# Patient Record
Sex: Male | Born: 1975 | Race: White | Hispanic: No | Marital: Single | Smoking: Current every day smoker
Health system: Southern US, Community
[De-identification: ages and names within clinical notes are randomized; demographics above are authoritative.]

## PROBLEM LIST (undated history)

## (undated) DIAGNOSIS — I509 Heart failure, unspecified: Secondary | ICD-10-CM

## (undated) DIAGNOSIS — F1910 Other psychoactive substance abuse, uncomplicated: Secondary | ICD-10-CM

## (undated) HISTORY — PX: HERNIA REPAIR: SHX51

---

## 2016-01-08 ENCOUNTER — Ambulatory Visit (INDEPENDENT_AMBULATORY_CARE_PROVIDER_SITE_OTHER): Payer: BLUE CROSS/BLUE SHIELD | Admitting: Family Medicine

## 2016-01-08 VITALS — BP 132/96 | HR 76 | Temp 98.2°F | Resp 17 | Ht 69.0 in | Wt 207.0 lb

## 2016-01-08 DIAGNOSIS — K029 Dental caries, unspecified: Secondary | ICD-10-CM

## 2016-01-08 DIAGNOSIS — K05219 Aggressive periodontitis, localized, unspecified severity: Secondary | ICD-10-CM

## 2016-01-08 MED ORDER — AMOXICILLIN 500 MG PO CAPS: 500.0000 mg | ORAL_CAPSULE | Freq: Three times a day (TID) | ORAL | Status: DC

## 2016-01-08 MED ORDER — HYDROCODONE-ACETAMINOPHEN 5-325 MG PO TABS: 1.0000 | ORAL_TABLET | Freq: Four times a day (QID) | ORAL | Status: DC | PRN

## 2016-01-08 NOTE — Progress Notes (Signed)
By signing my name below I, Allen Bass, attest that this documentation has been prepared under the direction and in the presence of Allen FloodJeffrey R Jmarion Christiano, MD. Electonically Signed. Allen Bass, Scribe 01/08/2016 at 11:49 AM  Subjective:    Patient ID: Allen Bass, male    DOB: 1975-08-06, 40 y.o.   MRN: [id number]  Chief Complaint  Patient presents with  . Dental Pain    facial swelling     HPI Allen Bass is a 40 y.o. male who presents to the [facility] complaining of facial swelling and dental pain. Pt states that he has an abscess tooth for the past year and there was a severe flare up of pain and swelling that started 2 days ago. Pt states he has been taking approximately 20-30 ibuprofen for pain within the last 24 hrs with minimal relief. Pt denies fever or chills.   Pt reports having similar symptoms treated with penicillin 3 months ago which resolved his facial swelling at that time.   Pt is scheduled to have his tooth removed in 2 weeks with dentist Allen Bass. Pt called Allen Bass yesterday and was recommended to be seen at an urgent care to get an antibiotic.   Pt denies history of kidney disease or gastric ulcers.   There are no active problems to display for this patient.  No past medical history on file. No past surgical history on file. No Known Allergies Prior to Admission medications   Not on File   Social History   Social History  . Marital Status: Unknown    Spouse Name: N/A  . Number of Children: N/A  . Years of Education: N/A   Occupational History  . Not on file.   Social History Main Topics  . Smoking status: Current Every Day Smoker -- 1.00 packs/day for 16 years    Types: Cigarettes  . Smokeless tobacco: Not on file  . Alcohol Use: Not on file  . Drug Use: Not on file  . Sexual Activity: Not on file   Other Topics Concern  . Not on file   Social History Narrative  . No narrative on file      Review of Systems    Constitutional: Negative for fever and chills.  HENT: Positive for dental problem and facial swelling. Negative for trouble swallowing.        Objective:   Physical Exam  Constitutional: He is oriented to person, place, and time. He appears well-developed and well-nourished. No distress.  HENT:  Head: Normocephalic and atraumatic.  Mouth/Throat: Oropharynx is clear and moist and mucous membranes are normal. Dental abscesses present.  Pt has a large area of decay on 1st molar with an abscess on the buccal side of the gingiva, no active discharge.  Pt has slight rt sided facial soft tissue swelling.  Eyes: Conjunctivae are normal. Pupils are equal, round, and reactive to light.  Neck: Neck supple.  Cardiovascular: Normal rate, regular rhythm and normal heart sounds.  Exam reveals no gallop and no friction rub.   No murmur heard. Pulmonary/Chest: Effort normal and breath sounds normal. No accessory muscle usage. He has no decreased breath sounds. He has no wheezes. He has no rhonchi. He has no rales.  Musculoskeletal: Normal range of motion.  Lymphadenopathy:    He has no cervical adenopathy.  Neurological: He is alert and oriented to person, place, and time. Gait normal.  Skin: Skin is warm and dry.  Psychiatric: He has a  normal mood and affect. His behavior is normal.  Nursing note and vitals reviewed.    Filed Vitals:   01/08/16 1059  BP: 132/96  Pulse: 76  Temp: 98.2 F (36.8 C)  TempSrc: Oral  Resp: 17  Height:  (1.753 m)  Weight: 207 lb (93.895 kg)  SpO2: 98%        Assessment & Plan:   Allen Bass is a 40 y.o. male Pain due to dental caries - Plan: amoxicillin (AMOXIL) 500 MG capsule, HYDROcodone-acetaminophen (NORCO/VICODIN) 5-325 MG tablet  Lateral periodontal abscess - Plan: amoxicillin (AMOXIL) 500 MG capsule, HYDROcodone-acetaminophen (NORCO/VICODIN) 5-325 MG tablet  Periapical abscess on the right side, beneath the mucosa from tooth number 30.   Discussed aspiration of abscess, but this was declined at current visit. No jaw tenderness, afebrile. Chose to try antibiotics initially, with follow-up with dental provider in the next 72 hours if not improving.  -Start amoxicillin 500 mg 3 times a day  -Ibuprofen up to 800 mg every 8 hours with food saltwater swish and spit. 9   -Lortab every 6 hours when necessary. Side effects discussed.  -RTC precautions.   Meds ordered this encounter  Medications  . amoxicillin (AMOXIL) 500 MG capsule    Sig: Take 1 capsule (500 mg total) by mouth 3 (three) times daily.    Dispense:  30 capsule    Refill:  0  . HYDROcodone-acetaminophen (NORCO/VICODIN) 5-325 MG tablet    Sig: Take 1 tablet by mouth every 6 (six) hours as needed for moderate pain.    Dispense:  15 tablet    Refill:  0   Patient Instructions       IF you received an x-ray today, you will receive an invoice from [facility]. Please contact [facility] at [phone] with questions or concerns regarding your invoice.   IF you received labwork today, you will receive an invoice from [employer]. Please contact [facility] at [phone] with questions or concerns regarding your invoice.   Our billing staff will not be able to assist you with questions regarding bills from these companies.  You will be contacted with the lab results as soon as they are available. The fastest way to get your results is to activate your My Chart account. Instructions are located on the last page of this paperwork. If you have not heard from [place] regarding the results in 2 weeks, please contact this office.     You do have an abscess on the outside of the affected tooth. As we discussed, this often needs to be opened. For now you can try the antibiotic, salt water swish and spit frequently, ibuprofen up to 800 mg every 8 hours with food, and other pain medication as needed. However if not improving, would  recommend you be seen by your dental provider first thing Monday. Return to the clinic or go to the nearest emergency room if any of your symptoms worsen or new symptoms occur.  Dental Abscess A dental abscess is a collection of pus in or around a tooth. CAUSES This condition is caused by a bacterial infection around the root of the tooth that involves the inner part of the tooth (pulp). It may result from:  Severe tooth decay.  Trauma to the tooth that allows bacteria to enter into the pulp, such as a broken or chipped tooth.  Severe gum disease around a tooth. SYMPTOMS Symptoms of this condition include:  Severe pain in and around the infected  tooth.  Swelling and redness around the infected tooth, in the mouth, or in the face.  Tenderness.  Pus drainage.  Bad breath.  Bitter taste in the mouth.  Difficulty swallowing.  Difficulty opening the mouth.  Nausea.  Vomiting.  Chills.  Swollen neck glands.  Fever. DIAGNOSIS This condition is diagnosed with examination of the infected tooth. During the exam, your dentist may tap on the infected tooth. Your dentist will also ask about your medical and dental history and may order X-rays. TREATMENT This condition is treated by eliminating the infection. This may be done with:  Antibiotic medicine.  A root canal. This may be performed to save the tooth.  Pulling (extracting) the tooth. This may also involve draining the abscess. This is done if the tooth cannot be saved. HOME CARE INSTRUCTIONS  Take medicines only as directed by your dentist.  If you were prescribed antibiotic medicine, finish all of it even if you start to feel better.  Rinse your mouth (gargle) often with salt water to relieve pain or swelling.  Do not drive or operate heavy machinery while taking pain medicine.  Do not apply heat to the outside of your mouth.  Keep all follow-up visits as directed by your dentist. This is important. SEEK  MEDICAL CARE IF:  Your pain is worse and is not helped by medicine. SEEK IMMEDIATE MEDICAL CARE IF:  You have a fever or chills.  Your symptoms suddenly get worse.  You have a very bad headache.  You have problems breathing or swallowing.  You have trouble opening your mouth.  You have swelling in your neck or around your eye.   This information is not intended to replace advice given to you by your health care provider. Make sure you discuss any questions you have with your health care provider.   Document Released: 07/11/2005 Document Revised: 11/25/2014 Document Reviewed: 07/08/2014 Elsevier Interactive Patient Education [employer]      I personally performed the services described in this documentation, which was scribed in my presence. The recorded information has been reviewed and considered, and addended by me as needed.   Signed,   Meredith Staggers, MD [facility].  01/08/2016 11:57 AM

## 2016-01-08 NOTE — Patient Instructions (Addendum)
IF you received an x-ray today, you will receive an invoice from [facility]. Please contact [facility] at [phone] with questions or concerns regarding your invoice.   IF you received labwork today, you will receive an invoice from [employer]. Please contact [facility] at [phone] with questions or concerns regarding your invoice.   Our billing staff will not be able to assist you with questions regarding bills from these companies.  You will be contacted with the lab results as soon as they are available. The fastest way to get your results is to activate your My Chart account. Instructions are located on the last page of this paperwork. If you have not heard from [place] regarding the results in 2 weeks, please contact this office.     You do have an abscess on the outside of the affected tooth. As we discussed, this often needs to be opened. For now you can try the antibiotic, salt water swish and spit frequently, ibuprofen up to 800 mg every 8 hours with food, and other pain medication as needed. However if not improving, would recommend you be seen by your dental provider first thing Monday. Return to the clinic or go to the nearest emergency room if any of your symptoms worsen or new symptoms occur.  Dental Abscess A dental abscess is a collection of pus in or around a tooth. CAUSES This condition is caused by a bacterial infection around the root of the tooth that involves the inner part of the tooth (pulp). It may result from:  Severe tooth decay.  Trauma to the tooth that allows bacteria to enter into the pulp, such as a broken or chipped tooth.  Severe gum disease around a tooth. SYMPTOMS Symptoms of this condition include:  Severe pain in and around the infected tooth.  Swelling and redness around the infected tooth, in the mouth, or in the face.  Tenderness.  Pus drainage.  Bad breath.  Bitter taste in the  mouth.  Difficulty swallowing.  Difficulty opening the mouth.  Nausea.  Vomiting.  Chills.  Swollen neck glands.  Fever. DIAGNOSIS This condition is diagnosed with examination of the infected tooth. During the exam, your dentist may tap on the infected tooth. Your dentist will also ask about your medical and dental history and may order X-rays. TREATMENT This condition is treated by eliminating the infection. This may be done with:  Antibiotic medicine.  A root canal. This may be performed to save the tooth.  Pulling (extracting) the tooth. This may also involve draining the abscess. This is done if the tooth cannot be saved. HOME CARE INSTRUCTIONS  Take medicines only as directed by your dentist.  If you were prescribed antibiotic medicine, finish all of it even if you start to feel better.  Rinse your mouth (gargle) often with salt water to relieve pain or swelling.  Do not drive or operate heavy machinery while taking pain medicine.  Do not apply heat to the outside of your mouth.  Keep all follow-up visits as directed by your dentist. This is important. SEEK MEDICAL CARE IF:  Your pain is worse and is not helped by medicine. SEEK IMMEDIATE MEDICAL CARE IF:  You have a fever or chills.  Your symptoms suddenly get worse.  You have a very bad headache.  You have problems breathing or swallowing.  You have trouble opening your mouth.  You have swelling in your neck or around your eye.   This  information is not intended to replace advice given to you by your health care provider. Make sure you discuss any questions you have with your health care provider.   Document Released: 07/11/2005 Document Revised: 11/25/2014 Document Reviewed: 07/08/2014 Elsevier Interactive Patient Education [employer] Inc[employer]

## 2017-07-21 ENCOUNTER — Inpatient Hospital Stay
Admission: EM | Admit: 2017-07-21 | Discharge: 2017-07-25 | DRG: 871 | Disposition: A | Discharge status: Home / Self Care | Payer: BLUE CROSS/BLUE SHIELD | Attending: Internal Medicine | Admitting: Internal Medicine

## 2017-07-21 ENCOUNTER — Emergency Department: Payer: BLUE CROSS/BLUE SHIELD

## 2017-07-21 ENCOUNTER — Encounter: Payer: Self-pay | Admitting: Internal Medicine

## 2017-07-21 ENCOUNTER — Inpatient Hospital Stay: Payer: BLUE CROSS/BLUE SHIELD

## 2017-07-21 DIAGNOSIS — J9601 Acute respiratory failure with hypoxia: Secondary | ICD-10-CM | POA: Diagnosis not present

## 2017-07-21 DIAGNOSIS — R739 Hyperglycemia, unspecified: Secondary | ICD-10-CM | POA: Diagnosis present

## 2017-07-21 DIAGNOSIS — Y92009 Unspecified place in unspecified non-institutional (private) residence as the place of occurrence of the external cause: Secondary | ICD-10-CM | POA: Diagnosis not present

## 2017-07-21 DIAGNOSIS — R000 Tachycardia, unspecified: Secondary | ICD-10-CM | POA: Diagnosis present

## 2017-07-21 DIAGNOSIS — A419 Sepsis, unspecified organism: Secondary | ICD-10-CM | POA: Diagnosis not present

## 2017-07-21 DIAGNOSIS — J690 Pneumonitis due to inhalation of food and vomit: Secondary | ICD-10-CM | POA: Diagnosis present

## 2017-07-21 DIAGNOSIS — N179 Acute kidney failure, unspecified: Secondary | ICD-10-CM | POA: Diagnosis present

## 2017-07-21 DIAGNOSIS — I509 Heart failure, unspecified: Secondary | ICD-10-CM

## 2017-07-21 DIAGNOSIS — J9602 Acute respiratory failure with hypercapnia: Secondary | ICD-10-CM | POA: Diagnosis not present

## 2017-07-21 DIAGNOSIS — I429 Cardiomyopathy, unspecified: Secondary | ICD-10-CM | POA: Diagnosis present

## 2017-07-21 DIAGNOSIS — F17210 Nicotine dependence, cigarettes, uncomplicated: Secondary | ICD-10-CM | POA: Diagnosis present

## 2017-07-21 DIAGNOSIS — R791 Abnormal coagulation profile: Secondary | ICD-10-CM | POA: Diagnosis present

## 2017-07-21 DIAGNOSIS — Z781 Physical restraint status: Secondary | ICD-10-CM

## 2017-07-21 DIAGNOSIS — E869 Volume depletion, unspecified: Secondary | ICD-10-CM | POA: Diagnosis present

## 2017-07-21 DIAGNOSIS — F1410 Cocaine abuse, uncomplicated: Secondary | ICD-10-CM | POA: Diagnosis not present

## 2017-07-21 DIAGNOSIS — R6521 Severe sepsis with septic shock: Secondary | ICD-10-CM | POA: Diagnosis present

## 2017-07-21 DIAGNOSIS — G92 Toxic encephalopathy: Secondary | ICD-10-CM | POA: Diagnosis present

## 2017-07-21 DIAGNOSIS — T50901A Poisoning by unspecified drugs, medicaments and biological substances, accidental (unintentional), initial encounter: Secondary | ICD-10-CM | POA: Diagnosis not present

## 2017-07-21 DIAGNOSIS — N170 Acute kidney failure with tubular necrosis: Secondary | ICD-10-CM | POA: Diagnosis present

## 2017-07-21 DIAGNOSIS — F1010 Alcohol abuse, uncomplicated: Secondary | ICD-10-CM | POA: Diagnosis present

## 2017-07-21 DIAGNOSIS — E874 Mixed disorder of acid-base balance: Secondary | ICD-10-CM | POA: Diagnosis present

## 2017-07-21 DIAGNOSIS — I459 Conduction disorder, unspecified: Secondary | ICD-10-CM | POA: Diagnosis present

## 2017-07-21 DIAGNOSIS — I5021 Acute systolic (congestive) heart failure: Secondary | ICD-10-CM | POA: Diagnosis not present

## 2017-07-21 DIAGNOSIS — I248 Other forms of acute ischemic heart disease: Secondary | ICD-10-CM | POA: Diagnosis present

## 2017-07-21 DIAGNOSIS — I420 Dilated cardiomyopathy: Secondary | ICD-10-CM | POA: Diagnosis not present

## 2017-07-21 DIAGNOSIS — Z8249 Family history of ischemic heart disease and other diseases of the circulatory system: Secondary | ICD-10-CM | POA: Diagnosis not present

## 2017-07-21 DIAGNOSIS — J9600 Acute respiratory failure, unspecified whether with hypoxia or hypercapnia: Secondary | ICD-10-CM

## 2017-07-21 HISTORY — DX: Other psychoactive substance abuse, uncomplicated: F19.10

## 2017-07-21 LAB — ACETAMINOPHEN LEVEL: Acetaminophen (Tylenol), Serum: <10 ug/mL — ABNORMAL LOW (ref 10–30)

## 2017-07-21 LAB — BASIC METABOLIC PANEL
Anion gap: 14 (ref 5–15)
BUN: 16 mg/dL (ref 6–20)
CALCIUM: 8.9 mg/dL (ref 8.9–10.3)
CO2: 22 mmol/L (ref 22–32)
CREATININE: 1.81 mg/dL — AB (ref 0.61–1.24)
Chloride: 98 mmol/L — ABNORMAL LOW (ref 101–111)
GFR calc Af Amer: 25 mL/min — ABNORMAL LOW (ref >60)
GFR calc non Af Amer: 21 mL/min — ABNORMAL LOW (ref >60)
GLUCOSE: 260 mg/dL — AB (ref 65–99)
Potassium: 4.7 mmol/L (ref 3.5–5.1)
Sodium: 134 mmol/L — ABNORMAL LOW (ref 135–145)

## 2017-07-21 LAB — URINALYSIS, COMPLETE (UACMP) WITH MICROSCOPIC
BACTERIA UA: NONE SEEN
BILIRUBIN URINE: NEGATIVE
Glucose, UA: >=500 mg/dL — AB
KETONES UR: NEGATIVE mg/dL
Leukocytes, UA: NEGATIVE
Nitrite: NEGATIVE
PH: 6 (ref 5.0–8.0)
PROTEIN: 100 mg/dL — AB
Specific Gravity, Urine: 1.007 (ref 1.005–1.030)

## 2017-07-21 LAB — URINE DRUG SCREEN, QUALITATIVE (ARMC ONLY)
AMPHETAMINES, UR SCREEN: NOT DETECTED
BARBITURATES, UR SCREEN: NOT DETECTED
Benzodiazepine, Ur Scrn: NOT DETECTED
Cannabinoid 50 Ng, Ur ~~LOC~~: NOT DETECTED
Cocaine Metabolite,Ur ~~LOC~~: POSITIVE — AB
MDMA (Ecstasy)Ur Screen: NOT DETECTED
METHADONE SCREEN, URINE: NOT DETECTED
OPIATE, UR SCREEN: NOT DETECTED
Phencyclidine (PCP) Ur S: NOT DETECTED
TRICYCLIC, UR SCREEN: NOT DETECTED

## 2017-07-21 LAB — CBC WITH DIFFERENTIAL/PLATELET
BASOS PCT: 1 %
Basophils Absolute: 0.1 10*3/uL (ref 0–0.1)
Eosinophils Absolute: 0.1 10*3/uL (ref 0–0.7)
Eosinophils Relative: 1 %
HEMATOCRIT: 50.5 % (ref 40.0–52.0)
HEMOGLOBIN: 16.6 g/dL (ref 13.0–18.0)
LYMPHS ABS: 2.8 10*3/uL (ref 1.0–3.6)
Lymphocytes Relative: 16 %
MCH: 29.2 pg (ref 26.0–34.0)
MCHC: 32.9 g/dL (ref 32.0–36.0)
MCV: 88.8 fL (ref 80.0–100.0)
MONOS PCT: 1 %
Monocytes Absolute: 0.2 10*3/uL (ref 0.2–1.0)
NEUTROS ABS: 14.7 10*3/uL — AB (ref 1.4–6.5)
NEUTROS PCT: 81 %
Platelets: 279 10*3/uL (ref 150–440)
RBC: 5.69 MIL/uL (ref 4.40–5.90)
RDW: 14.6 % — ABNORMAL HIGH (ref 11.5–14.5)
WBC: 17.9 10*3/uL — ABNORMAL HIGH (ref 3.8–10.6)

## 2017-07-21 LAB — BLOOD GAS, VENOUS
ACID-BASE DEFICIT: 8.9 mmol/L — AB (ref 0.0–2.0)
BICARBONATE: 23.4 mmol/L (ref 20.0–28.0)
O2 SAT: 35 %
PCO2 VEN: 79 mmHg — AB (ref 44.0–60.0)
Patient temperature: 37
pH, Ven: 7.08 — CL (ref 7.250–7.430)
pO2, Ven: 31 mmHg — CL (ref 32.0–45.0)

## 2017-07-21 LAB — ETHANOL: Alcohol, Ethyl (B): <10 mg/dL (ref <10)

## 2017-07-21 LAB — TROPONIN I: Troponin I: 0.05 ng/mL (ref <0.03)

## 2017-07-21 LAB — GLUCOSE, CAPILLARY: Glucose-Capillary: 78 mg/dL (ref 65–99)

## 2017-07-21 LAB — SALICYLATE LEVEL: Salicylate Lvl: <7 mg/dL (ref 2.8–30.0)

## 2017-07-21 LAB — LACTIC ACID, PLASMA: Lactic Acid, Venous: 7.2 mmol/L (ref 0.5–1.9)

## 2017-07-21 LAB — PROTIME-INR
INR: 1.01
PROTHROMBIN TIME: 13.2 s (ref 11.4–15.2)

## 2017-07-21 MED ORDER — NOREPINEPHRINE BITARTRATE 1 MG/ML IV SOLN
0.0000 ug/min | INTRAVENOUS | Status: DC
  Filled 2017-07-21: qty 4

## 2017-07-21 MED ORDER — MIDAZOLAM HCL 2 MG/2ML IJ SOLN
2.0000 mg | INTRAMUSCULAR | Status: DC | PRN
  Administered 2017-07-22 – 2017-07-23 (×5): 4 mg via INTRAVENOUS
  Filled 2017-07-21 (×7): qty 4

## 2017-07-21 MED ORDER — IPRATROPIUM-ALBUTEROL 0.5-2.5 (3) MG/3ML IN SOLN
3.0000 mL | Freq: Four times a day (QID) | RESPIRATORY_TRACT | Status: DC
  Administered 2017-07-22 – 2017-07-23 (×8): 3 mL via RESPIRATORY_TRACT
  Filled 2017-07-21 (×8): qty 3

## 2017-07-21 MED ORDER — INSULIN ASPART 100 UNIT/ML ~~LOC~~ SOLN
0.0000 [IU] | SUBCUTANEOUS | Status: DC
  Administered 2017-07-24 (×2): 1 [IU] via SUBCUTANEOUS
  Filled 2017-07-21 (×2): qty 1

## 2017-07-21 MED ORDER — DEXTROSE 5 % IV SOLN
500.0000 mg | Freq: Once | INTRAVENOUS | Status: AC
  Administered 2017-07-21: 500 mg via INTRAVENOUS
  Filled 2017-07-21: qty 500

## 2017-07-21 MED ORDER — MIDAZOLAM HCL 2 MG/2ML IJ SOLN
2.0000 mg | INTRAMUSCULAR | Status: AC | PRN
  Administered 2017-07-21 – 2017-07-22 (×3): 4 mg via INTRAVENOUS
  Filled 2017-07-21 (×3): qty 4

## 2017-07-21 MED ORDER — FENTANYL BOLUS VIA INFUSION
50.0000 ug | INTRAVENOUS | Status: DC | PRN
Start: 2017-07-21 — End: 2017-07-23
  Filled 2017-07-21: qty 100

## 2017-07-21 MED ORDER — ASPIRIN 300 MG RE SUPP: 300.0000 mg | RECTAL | Status: AC

## 2017-07-21 MED ORDER — CHLORHEXIDINE GLUCONATE 0.12% ORAL RINSE (MEDLINE KIT)
15.0000 mL | Freq: Two times a day (BID) | OROMUCOSAL | Status: DC
  Administered 2017-07-22 (×3): 15 mL via OROMUCOSAL

## 2017-07-21 MED ORDER — FENTANYL 2500MCG IN NS 250ML (10MCG/ML) PREMIX INFUSION
0.0000 ug/h | INTRAVENOUS | Status: DC
Start: 2017-07-21 — End: 2017-07-21
  Filled 2017-07-21: qty 250

## 2017-07-21 MED ORDER — SENNOSIDES 8.8 MG/5ML PO SYRP
5.0000 mL | ORAL_SOLUTION | Freq: Two times a day (BID) | ORAL | Status: DC | PRN
  Filled 2017-07-21: qty 5

## 2017-07-21 MED ORDER — ORAL CARE MOUTH RINSE
15.0000 mL | Freq: Four times a day (QID) | OROMUCOSAL | Status: DC
  Administered 2017-07-22 (×3): 15 mL via OROMUCOSAL

## 2017-07-21 MED ORDER — SUCCINYLCHOLINE CHLORIDE 20 MG/ML IJ SOLN
150.0000 mg | Freq: Once | INTRAMUSCULAR | Status: AC
  Administered 2017-07-21: 150 mg via INTRAVENOUS

## 2017-07-21 MED ORDER — SODIUM CHLORIDE 0.9 % IV SOLN: 250.0000 mL | INTRAVENOUS | Status: DC | PRN

## 2017-07-21 MED ORDER — HEPARIN SODIUM (PORCINE) 5000 UNIT/ML IJ SOLN
5000.0000 [IU] | Freq: Three times a day (TID) | INTRAMUSCULAR | Status: DC
  Administered 2017-07-22 – 2017-07-25 (×10): 5000 [IU] via SUBCUTANEOUS
  Filled 2017-07-21 (×10): qty 1

## 2017-07-21 MED ORDER — MIDAZOLAM HCL 5 MG/5ML IJ SOLN
2.0000 mg | Freq: Once | INTRAMUSCULAR | Status: AC
  Administered 2017-07-21: 2 mg via INTRAVENOUS

## 2017-07-21 MED ORDER — PANTOPRAZOLE SODIUM 40 MG IV SOLR
40.0000 mg | Freq: Every day | INTRAVENOUS | Status: DC
  Administered 2017-07-22 – 2017-07-25 (×4): 40 mg via INTRAVENOUS
  Filled 2017-07-21 (×4): qty 40

## 2017-07-21 MED ORDER — AMPICILLIN-SULBACTAM SODIUM 3 (2-1) G IJ SOLR
3.0000 g | Freq: Once | INTRAMUSCULAR | Status: AC
  Administered 2017-07-22: 3 g via INTRAVENOUS
  Filled 2017-07-21: qty 3

## 2017-07-21 MED ORDER — SODIUM CHLORIDE 0.9 % IV BOLUS (SEPSIS)
1000.0000 mL | INTRAVENOUS | Status: AC
  Administered 2017-07-21 – 2017-07-22 (×2): 1000 mL via INTRAVENOUS

## 2017-07-21 MED ORDER — BISACODYL 10 MG RE SUPP: 10.0000 mg | Freq: Every day | RECTAL | Status: DC | PRN

## 2017-07-21 MED ORDER — ASPIRIN 81 MG PO CHEW: 324.0000 mg | CHEWABLE_TABLET | ORAL | Status: AC

## 2017-07-21 MED ORDER — SODIUM CHLORIDE 0.9 % IV BOLUS (SEPSIS)
1000.0000 mL | Freq: Once | INTRAVENOUS | Status: AC
  Administered 2017-07-22: 1000 mL via INTRAVENOUS

## 2017-07-21 MED ORDER — FENTANYL CITRATE (PF) 100 MCG/2ML IJ SOLN
INTRAMUSCULAR | Status: AC
  Filled 2017-07-21: qty 10

## 2017-07-21 MED ORDER — LEVALBUTEROL HCL 0.63 MG/3ML IN NEBU: 0.6300 mg | INHALATION_SOLUTION | Freq: Four times a day (QID) | RESPIRATORY_TRACT | Status: DC | PRN

## 2017-07-21 MED ORDER — SODIUM CHLORIDE 0.9 % IV BOLUS (SEPSIS)
1000.0000 mL | Freq: Once | INTRAVENOUS | Status: AC
  Administered 2017-07-21: 1000 mL via INTRAVENOUS

## 2017-07-21 MED ORDER — FENTANYL CITRATE (PF) 100 MCG/2ML IJ SOLN
100.0000 ug | Freq: Once | INTRAMUSCULAR | Status: AC
  Administered 2017-07-21: 100 ug via INTRAVENOUS

## 2017-07-21 MED ORDER — PROPOFOL 1000 MG/100ML IV EMUL
INTRAVENOUS | Status: AC
  Filled 2017-07-21: qty 100

## 2017-07-21 MED ORDER — CHLORHEXIDINE GLUCONATE 0.12% ORAL RINSE (MEDLINE KIT): 15.0000 mL | Freq: Two times a day (BID) | OROMUCOSAL | Status: DC

## 2017-07-21 MED ORDER — SODIUM CHLORIDE 0.9 % IV SOLN
0.5000 mg/h | INTRAVENOUS | Status: DC
  Administered 2017-07-21: 4 mg/h via INTRAVENOUS
  Filled 2017-07-21: qty 10

## 2017-07-21 MED ORDER — PROPOFOL 1000 MG/100ML IV EMUL
5.0000 ug/kg/min | Freq: Once | INTRAVENOUS | Status: AC
  Administered 2017-07-21: 5 ug/kg/min via INTRAVENOUS

## 2017-07-21 MED ORDER — AZITHROMYCIN 500 MG IV SOLR
500.0000 mg | INTRAVENOUS | Status: DC
  Administered 2017-07-23 – 2017-07-24 (×2): 500 mg via INTRAVENOUS
  Filled 2017-07-21 (×3): qty 500

## 2017-07-21 MED ORDER — SODIUM CHLORIDE 0.9 % IV BOLUS (SEPSIS): 1000.0000 mL | Freq: Once | INTRAVENOUS | Status: DC

## 2017-07-21 MED ORDER — FAMOTIDINE IN NACL 20-0.9 MG/50ML-% IV SOLN
20.0000 mg | Freq: Two times a day (BID) | INTRAVENOUS | Status: DC
  Administered 2017-07-22 – 2017-07-24 (×6): 20 mg via INTRAVENOUS
  Filled 2017-07-21 (×7): qty 50

## 2017-07-21 MED ORDER — ACETAMINOPHEN 325 MG PO TABS: 650.0000 mg | ORAL_TABLET | ORAL | Status: DC | PRN

## 2017-07-21 MED ORDER — ORAL CARE MOUTH RINSE: 15.0000 mL | Freq: Four times a day (QID) | OROMUCOSAL | Status: DC

## 2017-07-21 MED ORDER — ONDANSETRON HCL 4 MG/2ML IJ SOLN
4.0000 mg | Freq: Four times a day (QID) | INTRAMUSCULAR | Status: DC | PRN
  Administered 2017-07-23: 4 mg via INTRAVENOUS
  Filled 2017-07-21: qty 2

## 2017-07-21 MED ORDER — FENTANYL CITRATE (PF) 2500 MCG/50ML IJ SOLN: 0.0000 ug/h | INTRAMUSCULAR | Status: DC

## 2017-07-21 MED ORDER — FENTANYL 2500MCG IN NS 250ML (10MCG/ML) PREMIX INFUSION
25.0000 ug/h | INTRAVENOUS | Status: DC
  Administered 2017-07-21: 200 ug/h via INTRAVENOUS
  Administered 2017-07-22 – 2017-07-23 (×5): 400 ug/h via INTRAVENOUS
  Filled 2017-07-21 (×5): qty 250

## 2017-07-21 MED ORDER — ETOMIDATE 2 MG/ML IV SOLN
50.0000 mg | Freq: Once | INTRAVENOUS | Status: AC
  Administered 2017-07-21: 50 mg via INTRAVENOUS

## 2017-07-21 NOTE — ED Triage Notes (Signed)
Pt brought in by [facility] from home.  Pt found by EMS with needles and blood on couch around him.  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  Pt responding to pain only.  Pt had nasal trumpet placed at 1905 , size #7 in R nare by Dr. Shaune PollackLord.  Pt on NRB at this time sat 100%.  Upon EMS arrival, pt O2 70s on RA.  EMS reports 2 narcan given intranasal by FD and 2 narcan given IV.  Pt shaking.  Gag reflex intact when nasal trumpet placed.

## 2017-07-21 NOTE — Consult Note (Signed)
PULMONARY / CRITICAL CARE MEDICINE   Name: Allen Bass MRN: [id number] DOB: Mar 18, 1976    ADMISSION DATE:  07/21/2017   CONSULTATION DATE:  07/21/2017  REFERRING MD: Dr. Anne Hahn  Reason for consult: Acute hypoxemic respiratory failure  HISTORY OF PRESENT ILLNESS:   This is a 41 year old Caucasian male with no known medical history on file who presented to the emergency room after being found unresponsive.  History is obtained from the ED and EMS records as patient is currently intubated and sedated.  Patient was found unresponsive by his roommate who called EMS.  Upon EMS arrival, patient was found unresponsive, surrounded by blood and IV drug use paraphernalia.  Per patient's roommate, patient abuses heroine, cocaine and narcotics.  Patient was given Narcan by EMS without any improvement in mentation.  However, his respirations did improve but not sufficient enough to maintain his oxygenation.  At the ED, he remained hypoxic and hypercarbic with severe agitation hence he was intubated for acute respiratory failure. His chest x-ray showed diffuse multifocal pneumonia likely secondary to aspiration.  Urine toxicology was positive for cocaine.  He is being admitted to the ICU for further management  PAST MEDICAL HISTORY :  He  has no past medical history on file.  PAST SURGICAL HISTORY: He  has no past surgical history on file.  No Known Allergies  No current facility-administered medications on file prior to encounter.    No current outpatient medications on file prior to encounter.    FAMILY HISTORY:  His has no family status information on file.    SOCIAL HISTORY: He  reports that he uses drugs. Drugs: Cocaine and Heroin.  REVIEW OF SYSTEMS:   Unable to obtain as patient is currently sedated and intubated  SUBJECTIVE:  VITAL SIGNS: BP (!) 131/101   Pulse (!) 133   Temp 99.6 F (37.6 C) (Core (Comment))   Resp (!) 26   Wt 113.4 kg (250 lb)   SpO2 100%    HEMODYNAMICS:    VENTILATOR SETTINGS: Vent Mode: AC FiO2 (%):  [30 %-100 %] 100 % Set Rate:  [18 bmp] 18 bmp Vt Set:  [500 mL] 500 mL PEEP:  [5 cmH20] 5 cmH20  INTAKE / OUTPUT: No intake/output data recorded.  PHYSICAL EXAMINATION: General: Well-nourished, well-developed, appropriate for age Neuro: Withdraws to pain, intermittent agitation, moves all extremities HEENT: Pupils pinpoint, sluggish, trachea midline, ET tube in place Cardiovascular: Apical pulse regular, S1-S2, no murmur regurg or gallop, +2 pulses bilaterally, no edema Lungs: Bilateral breath sounds with diffuse rhonchi in [place] in all lung fields, no wheezing Abdomen: Soft, nontender nondistended normal bowel sounds in all 4 quadrants Musculoskeletal: No joint deformities, positive range of motion Skin: Diaphoretic, no rash  LABS:  BMET Recent Labs  Lab 07/21/17 1915  NA 134*  K 4.7  CL 98*  CO2 22  BUN 16  CREATININE 1.81*  GLUCOSE 260*    Electrolytes Recent Labs  Lab 07/21/17 1915  CALCIUM 8.9    CBC Recent Labs  Lab 07/21/17 1915  WBC 17.9*  HGB 16.6  HCT 50.5  PLT 279    Coag's Recent Labs  Lab 07/21/17 1930  INR 1.01    Sepsis Markers Recent Labs  Lab 07/21/17 1930  LATICACIDVEN 7.2*    ABG No results for input(s): PHART, PCO2ART, PO2ART in the last 168 hours.  Liver Enzymes No results for input(s): AST, ALT, ALKPHOS, BILITOT, ALBUMIN in the last 168 hours.  Cardiac Enzymes Recent Labs  Lab  07/21/17 1915  TROPONINI 0.05*    Glucose No results for input(s): GLUCAP in the last 168 hours.  Imaging Dg Chest Port 1 View  Result Date: 07/21/2017 CLINICAL DATA:  Heroin overdose. EXAM: PORTABLE CHEST 1 VIEW COMPARISON:  July 21, 2017 FINDINGS: Heart size is normal. Mediastinal contour is normal. Endotracheal tube is identified with distal tip 5.8 cm from carina. Nasogastric tube is noted with distal tip not included on the film but is at least in the stomach.  Patchy consolidation of bilateral lungs are noted. There is no pleural effusion. No acute abnormalities identified within the bony structures. IMPRESSION: Patchy consolidation of bilateral which may be due to asymmetric edema, ARDS or pneumonia. This is not significantly changed compared to prior exam. Endotracheal tube in good position.  There is no pneumothorax. Electronically Signed   By: Sherian ReinWei-Chen  Lin M.D.   On: 07/21/2017 21:14   Dg Chest Port 1 View  Result Date: 07/21/2017 CLINICAL DATA:  Male with hypoxia and possible overdose. EXAM: PORTABLE CHEST 1 VIEW COMPARISON:  None. FINDINGS: Diffuse hazy airspace density primarily involving the left lung may represent asymmetric edema, ARDS, or pneumonia. Clinical correlation is recommended. No lobar consolidation, pleural effusion, or pneumothorax. The cardiac silhouette is within normal limits. No acute osseous pathology. IMPRESSION: Findings may represent asymmetric edema, ARDS or pneumonia. Clinical correlation is recommended. Electronically Signed   By: Elgie CollardArash  Radparvar M.D.   On: 07/21/2017 19:49   Dg Abd Portable 1 View  Result Date: 07/21/2017 CLINICAL DATA:  Feeding tube placement. EXAM: PORTABLE ABDOMEN - 1 VIEW COMPARISON:  None. FINDINGS: The bowel gas pattern is normal. Nasogastric tube is identified with distal tip in the mid stomach. Patchy consolidation of the bilateral lungs are noted. IMPRESSION: Nasogastric tube is identified with distal tip in the mid stomach. Electronically Signed   By: Sherian ReinWei-Chen  Lin M.D.   On: 07/21/2017 21:16   STUDIES:  None  CULTURES: Blood cultures x2 Urine culture Sputum culture  ANTIBIOTICS: Unasyn was 1 Zosyn Azithromycin  SIGNIFICANT EVENTS: 07/21/2017: Admitted  LINES/TUBES: Peripheral IVs Foley catheter ET tube  DISCUSSION: 41 year old male presenting with acute hypoxic/hypercarbic respiratory failure secondary to overdose, acute encephalopathy, AKI and severe lactic  acidosis  ASSESSMENT / PLAN:  PULMONARY A: Acute hypoxic/hypercarbic respiratory failure Multifocal pneumonia secondary to aspiration ARDS Lactic acidosis P:   Full vent support with current settings Stat ABG post intubation reviewed vent changes made Nebulized bronchodilators Chest x-ray and ABG as needed Weaning trials as tolerated  CARDIOVASCULAR A: Septic shock Elevated troponin- mild troponin leak likely due to demand ischemia and hypoxemia P:  Trend cardiac enzymes Hemodynamics per ICU protocol IV fluids and as needed pressors to maintain mean arterial blood pressure greater than 65  RENAL A:   Acute renal failure secondary to volume depletion P:   IV fluids Trend creatinine Monitor and replace electrolytes  GASTROINTESTINAL A:   No acute issues P:   PPI for GI prophylaxis  HEMATOLOGIC A:   No acute issues P:  Subcu heparin for DVT prophylaxis  INFECTIOUS A:   Multifocal pneumonia Sepsis secondary to pneumonia P:   Breath excess of both Follow-up cultures Trend pro-calcitonin and adjust antibiotics if needed  ENDOCRINE A:   Hyperglycemia without a diagnosis of diabetes P:   Blood glucose monitoring every 4 hours with sensitive sliding scale insulin coverage  NEUROLOGIC A:   Acute encephalopathy secondary to hypoxemia, hypercarbia and drug abuse Severe agitation  p:   RASS goal: -1 to -2  Fentanyl and Versed for vent sedation Treat hypercarbia and hypoxemia IV fluids   FAMILY  - Updates: No family at bedside.  Will update when available  - Inter-disciplinary family meet or Palliative Care meeting due by:  day 7  Critical care time 90 minutes  Efosa Treichler S. [facility] ANP-BC Pulmonary and Critical Care Medicine [facility] Pager [phone] or [phone] 07/21/2017, 10:06 PM

## 2017-07-21 NOTE — Progress Notes (Signed)
Pharmacy Antibiotic Note  Allen Bass is a [age] y.o. male admitted on 07/21/2017 with pneumonia.  Pharmacy has been consulted for azithromycin dosing.  Found unresponsive with hx of drug use per roommate  Plan: Patient to receive Azithromycin 500mg  IV x 1 in ER. Will continue with Azithromycin 500 mg IV q24h  Weight: 250 lb (113.4 kg)  Temp (24hrs), Avg:94.3 F (34.6 C), Min:94.3 F (34.6 C), Max:94.3 F (34.6 C)  Recent Labs  Lab 07/21/17 1915  WBC 17.9*  CREATININE 1.81*    CrCl cannot be calculated (Unknown ideal weight.).    No Known Allergies  Antimicrobials this admission: Unasyn x 1 12/28 >>   Azith 12/28 >>    Dose adjustments this admission:    Microbiology results: 12/28 BCx: pending   UCx:      Sputum:      MRSA PCR:    Thank you for allowing pharmacy to be a part of this patient's care.  Blakeley Margraf A 07/21/2017 8:12 PM

## 2017-07-21 NOTE — ED Provider Notes (Addendum)
[facility] Provider Note ____________________________________________   I have reviewed the triage vital signs and the triage nursing note.  HISTORY  Chief Complaint Drug Overdose   Historian Level 5 Caveat History Limited by critical illness, overdose, altered mental status  History from EMS at the scene  HPI Allen Bass is a 41 y.o. male, unknown name and date of birth, appears to be in his 330s-40s, found by roommate unresponsive and EMS was called and found the patient surrounded by blood and paraphernalia of IV drug abuse.  Roommate apparently stated the patient does use heroin, cocaine as well as narcotics.  Clinically presumed overdose.  Fire first responders had given to milligrams Narcan nasal, and EMTs gave 2 additional milligrams of Narcan by IV.  Patient did increase his respirations and was slightly agitated, certainly responding to painful stimuli, but not opening his eyes or following commands.   No past medical history on file.  Patient Active Problem List   Diagnosis Date Noted  . Sepsis (HCC) 07/21/2017  . Acute respiratory failure with hypoxia and hypercapnia (HCC) 07/21/2017  . Drug overdose 07/21/2017  . Aspiration pneumonia (HCC) 07/21/2017  . AKI (acute kidney injury) (HCC) 07/21/2017      Prior to Admission medications   Not on File    No Known Allergies  No family history on file.  Social History Social History   Tobacco Use  . Smoking status: Not on file  Substance Use Topics  . Alcohol use: Not on file  . Drug use: Yes    Types: Cocaine, Heroin    Review of Systems   Unknown due to critical illness/overdose/altered mental status.  ____________________________________________   PHYSICAL EXAM:  VITAL SIGNS: ED Triage Vitals [07/21/17 1913]  Enc Vitals Group     BP (!) 131/101     Pulse Rate (!) 133     Resp (!) 26     Temp (!) 94.3 F (34.6 C)     Temp Source Axillary      SpO2 100 %     Weight      Height      Head Circumference      Peak Flow      Pain Score      Pain Loc      Pain Edu?      Excl. in GC?      Constitutional: Awake, moaning, not opening his eyes, he does localize to pain. HEENT   Head: Normocephalic and atraumatic.      Eyes: Conjunctivae are normal. Pupils equal and round, small, 2 mm.      Ears:         Nose: No congestion/rhinnorhea.   Mouth/Throat: Mucous membranes are moist.  Gag reflex intact.   Neck: No stridor. Cardiovascular/Chest: Cardiac rate, regular rhythm.  No murmurs, rubs, or gallops. Respiratory: Normal respiratory effort without tachypnea nor retractions.  Rhonchi throughout. Gastrointestinal: Soft.  Obese. Genitourinary/rectal:Deferred Musculoskeletal: Moving 4 extremities.  No obvious evidence of trauma. Neurologic: No facial droop.  Patient is localizing/responding to pain, but only moaning verbally.  He does have an intact gag reflex.  He is moving 4 extremities voluntarily.   Skin:  Skin is warm, dry and intact. No rash noted.   ____________________________________________  LABS (pertinent positives/negatives) I, Governor Rooksebecca Temekia Caskey, MD the attending physician have reviewed the labs noted below.  Labs Reviewed  ACETAMINOPHEN LEVEL - Abnormal; Notable for the following components:      Result Value  Acetaminophen (Tylenol), Serum <10 (*)    All other components within normal limits  BASIC METABOLIC PANEL - Abnormal; Notable for the following components:   Sodium 134 (*)    Chloride 98 (*)    Glucose, Bld 260 (*)    Creatinine, Ser 1.81 (*)    GFR calc non Af Amer 21 (*)    GFR calc Af Amer 25 (*)    All other components within normal limits  TROPONIN I - Abnormal; Notable for the following components:   Troponin I 0.05 (*)    All other components within normal limits  LACTIC ACID, PLASMA - Abnormal; Notable for the following components:   Lactic Acid, Venous 7.2 (*)    All other components  within normal limits  CBC WITH DIFFERENTIAL/PLATELET - Abnormal; Notable for the following components:   WBC 17.9 (*)    RDW 14.6 (*)    Neutro Abs 14.7 (*)    All other components within normal limits  BLOOD GAS, VENOUS - Abnormal; Notable for the following components:   pH, Ven 7.08 (*)    pCO2, Ven 79 (*)    pO2, Ven 31.0 (*)    Acid-base deficit 8.9 (*)    All other components within normal limits  URINALYSIS, COMPLETE (UACMP) WITH MICROSCOPIC - Abnormal; Notable for the following components:   Color, Urine YELLOW (*)    APPearance HAZY (*)    Glucose, UA >=500 (*)    Hgb urine dipstick MODERATE (*)    Protein, ur 100 (*)    Squamous Epithelial / LPF 0-5 (*)    All other components within normal limits  URINE DRUG SCREEN, QUALITATIVE ([facility] ONLY) - Abnormal; Notable for the following components:   Cocaine Metabolite,Ur [place] POSITIVE (*)    All other components within normal limits  CULTURE, BLOOD (ROUTINE X 2)  CULTURE, BLOOD (ROUTINE X 2)  ETHANOL  SALICYLATE LEVEL  PROTIME-INR  LACTIC ACID, PLASMA    ____________________________________________    EKG I, Governor Rooksebecca Joshuajames Moehring, MD, the attending physician have personally viewed and interpreted all ECGs.  136 bpm.  Sinus tachycardia.  Nonspecific intraventricular conduction delay.  Normal axis.  Nonspecific ST and T wave ____________________________________________  RADIOLOGY All Xrays were viewed by me.  Imaging interpreted by Radiologist, and I, Governor Rooksebecca Amilio Zehnder, MD the attending physician have reviewed the radiologist interpretation noted below.  Chest x-ray portable:  FINDINGS: Diffuse hazy airspace density primarily involving the left lung may represent asymmetric edema, ARDS, or pneumonia. Clinical correlation is recommended. No lobar consolidation, pleural effusion, or pneumothorax. The cardiac silhouette is within normal limits. No acute osseous pathology.  IMPRESSION: Findings may represent asymmetric edema, ARDS or  pneumonia. Clinical correlation is recommended. __________________________________________  PROCEDURES  Procedure(s) performed: INTUBATION Performed by: Governor Rooksebecca Leonette Tischer  Required items: required blood products, implants, devices, and special equipment available Patient identity confirmed: provided demographic data and hospital-assigned identification number   Indications: Hypercarbic respiratory failure, hypoxic respiratory failure, agitation  Intubation method: Glidescope Laryngoscopy   Preoxygenation: BiPAP/BVM  Sedatives: Etomidate Paralytic: Succinylcholine  Tube Size: 7.5 cuffed  Post-procedure assessment: chest rise and ETCO2 monitor Breath sounds: equal and absent over the epigastrium Tube secured with: ETT holder Chest x-ray interpreted by radiologist and me.  Chest x-ray findings: endotracheal tube in appropriate position  Patient tolerated the procedure well with no immediate complications.     Critical Care performed: CRITICAL CARE Performed by: Governor Rooksebecca Shaden Higley   Total critical care time: 60 minutes  Critical care time was exclusive  of separately billable procedures and treating other patients.  Critical care was necessary to treat or prevent imminent or life-threatening deterioration.  Critical care was time spent personally by me on the following activities: development of treatment plan with patient and/or surrogate as well as nursing, discussions with consultants, evaluation of patient's response to treatment, examination of patient, obtaining history from patient or surrogate, ordering and performing treatments and interventions, ordering and review of laboratory studies, ordering and review of radiographic studies, pulse oximetry and re-evaluation of patient's condition.    ____________________________________________  ED COURSE / ASSESSMENT AND PLAN  Pertinent labs & imaging results that were available during my care of the patient were reviewed by me  and considered in my medical decision making (see chart for details).    Admit patient of the room brought in by EMS due to unresponsive due to presumed narcotic/likely heroin overdose.  Patient was found by paraphernalia of IV drug abuse.  No clinical concern based on description from EMS on on exam for trauma at this point.  If mental status does not improve, might reconsider.  He is agitated and tremulous, not opening his eyes, moaning sound but localizing to pain, initial O2 sat was in the 70s, patient was placed on nonrebreather with a nasal trumpet which confirmed patient does have a good gag reflex, and O2 sat came up into the 90s.  Patient tachycardic, started on IV fluids.  Chest x-ray shows pulmonary edema versus early hours versus pneumonia/aspiration.  I will go ahead and place patient on antibiotic coverage for pneumonia as well as aspiration pneumonia with Unasyn and azithromycin.  Blood cultures have been sent.  ABG significant for low pH, elevated PCO2.  Patient will be tried on BiPAP to see if he improves with that prior to decision about possible intubation.  Discussed with hospitalist for ICU admission.  DIFFERENTIAL DIAGNOSIS: Including but not limited to drug intoxication, drug overdose, acute renal failure, sepsis, hypothermia from exposure, seizure/postictal, electrolyte disturbance, etc.  CONSULTATIONS: Hospitalist for admission, discussed with Dr. Katheren Shams.   Patient / Family / Caregiver informed of clinical course, medical decision-making process, and agree with plan.   Addended to include after discussion with hospitalist, Dr. Anne Hahn, we discussed the best plan for care the patient overnight in the ICU.  Given no intensivist in-house in the ICU, it is safest for the patient to go ahead and intubate now in the emergency department.  He does have an improving mental status compared to when he came in tonight, however he is still agitated and unable to follow  commands.  He is likely going to require medication to manage the agitation, which I think would drop his respiratory drive and create an emergency respiratory failure situation when intervention might be less controlled than doing the intubation right now.  In addition, given the severity of his chest x-ray findings right now, I do have some concern that if this is or is it may worsen before it gets better, once again exposing the patient to risk if he were to worsen without being able to access intubation quickly if he decompensates acutely.  Patient was opening his eyes, but unable to follow commands.  Patient was preoxygenated on the BiPAP, patient was given etomidate and succinylcholine and was intubated without complication with glide scope and 7.5 tube.  Sedated with propofol.  ___________________________________________   FINAL CLINICAL IMPRESSION(S) / ED DIAGNOSES   Final diagnoses:  Acute respiratory failure with hypoxia and hypercapnia (HCC)  ___________________________________________        Note: This dictation was prepared with Dragon dictation. Any transcriptional errors that result from this process are unintentional    Governor Rooks, MD 07/21/17 2013    Governor Rooks, MD 07/21/17 2113

## 2017-07-21 NOTE — ED Notes (Signed)
Date and time results received: 07/21/17 7:57 PM  Test: Troponin Critical Value: 0.05  Name of Provider Notified: Dr. Shaune PollackLord  Orders Received? Or Actions Taken?: acknowledged

## 2017-07-21 NOTE — Progress Notes (Signed)
eLink Physician-Brief Progress Note Patient Name: Allen Bass DOB: 07/26/1975 MRN: [id number]   Date of Service  07/21/2017  HPI/Events of Note  89 M with h/o of drug abuse found unresponsive by roommate with drug paraphernalia nearby.  Positive for cocaine.  Initially patient was moderately responsive on BiPAP but there was concern for airway protection and also concern for aspiration.  On camera check the patient is intubated and sedated on fentanyl/versed.  He is HD stable with RR of 17, HR of 87, BP 103/67 (78) and sats of 100%.  Of note lactate was elevated at 7.2 and trop I was 0.05  ABG showed a mixed resp and metabolic acidosis  eICU Interventions  Vent/Sedation orders ABX per primary admitting team Trend lacate/Trop Recheck ABG PCCM to see Continue [facility] via [facility]     Intervention Category Evaluation Type: New Patient Evaluation  DETERDING,ELIZABETH 07/21/2017, 10:44 PM

## 2017-07-21 NOTE — Progress Notes (Signed)
   07/21/17 1905  Clinical Encounter Type  Visited With Patient  Referral From Nurse  Consult/Referral To Chaplain   Chaplain responded to page.  Per nurse, pt was non responsive.  No family was present.  Per EMT no family would be coming in at this time.  Chaplain remains available for spiritual/emotional support.  Rev. Kamala Kolton Zenaida NieceVan [employer] Chaplain

## 2017-07-21 NOTE — H&P (Signed)
[facility] [place] [facility]   PATIENT NAME: Allen Bass    MR#:  [id number]  DATE OF BIRTH:  06/10/1976  DATE OF ADMISSION:  07/21/2017  PRIMARY CARE PHYSICIAN: No primary care provider on file.   REQUESTING/REFERRING PHYSICIAN: Shaune PollackLord, MD  CHIEF COMPLAINT:   Chief Complaint  Patient presents with  . Drug Overdose    HISTORY OF PRESENT ILLNESS:  Allen Bass  is a 41 y.o. male who presents with acute respiratory failure due to likely overdose versus sedation from substance abuse and subsequent aspiration pneumonia.  Patient was found by his roommate on the couch unresponsive surrounded by some blood and IV drug paraphernalia.  Patient's roommate does state that he has a history of using heroin and other narcotics as well as cocaine.  His urine tox screen here is positive for cocaine.  He presented to the ED in respiratory failure.  He stabilized some on BiPAP initially, but neurologically he is still confused and trying to remove the BiPAP mask.  He will open his eyes and has some purposeful movements but does not follow commands.  Chest x-ray was suggestive of diffuse pneumonia versus early ARDS.  Decision was made to intubate the patient in order to better treat him, and hospitalist were called for admission.  PAST MEDICAL HISTORY:  Past medical history unknown and patient is unable to provide this information at this time  PAST SURGICAL HISTORY:  Past surgical history is unknown and the patient is unable to provide this information at this time  SOCIAL HISTORY:   Social History   Tobacco Use  . Smoking status: Not on file  Substance Use Topics  . Alcohol use: Not on file  Patient is unable to provide his social history, though his roommate does state that he has a history of drug use, specifically heroin cocaine and other narcotics  FAMILY HISTORY:  Family history unknown and the patient is unable to provide this information at this  time  DRUG ALLERGIES:  No Known Allergies though this will also need to be reviewed once more information is available or the patient is able to provide more information  MEDICATIONS AT HOME:   Prior to Admission medications   Not on File    REVIEW OF SYSTEMS:  Review of Systems  Unable to perform ROS: Critical illness     VITAL SIGNS:   Vitals:   07/21/17 1913 07/21/17 1954 07/21/17 2000  BP: (!) 131/101    Pulse: (!) 133    Resp: (!) 26    Temp: (!) 94.3 F (34.6 C)    TempSrc: Axillary    SpO2: 100%  100%  Weight:  113.4 kg (250 lb)    Wt Readings from Last 3 Encounters:  07/21/17 113.4 kg (250 lb)    PHYSICAL EXAMINATION:  Physical Exam  Vitals reviewed. Constitutional: He appears well-developed and well-nourished.  HENT:  Head: Normocephalic and atraumatic.  Mouth/Throat: Oropharynx is clear and moist.  Eyes: Conjunctivae are normal. No scleral icterus.  Neck: Normal range of motion. Neck supple. No JVD present. No thyromegaly present.  Cardiovascular: Regular rhythm and intact distal pulses. Exam reveals no gallop and no friction rub.  No murmur heard. Tachycardic  Respiratory: Effort normal. No respiratory distress. He has wheezes. He has rales.  GI: Soft. Bowel sounds are normal. He exhibits no distension. There is no tenderness.  Musculoskeletal: Normal range of motion. He exhibits no edema.  No arthritis, no gout  Lymphadenopathy:  He has no cervical adenopathy.  Neurological: No cranial nerve deficit.  Patient has his eyes open, and seems to track some with his eyes though not consistently.  He has some purposeful movements and trying to remove his BiPAP mask, but does not follow any commands.  He does localize to discomfort  Skin: Skin is warm and dry. No rash noted. No erythema.  Psychiatric:  Unable to assess due to patient condition    LABORATORY PANEL:   CBC Recent Labs  Lab 07/21/17 1915  WBC 17.9*  HGB 16.6  HCT 50.5  PLT 279    ------------------------------------------------------------------------------------------------------------------  Chemistries  Recent Labs  Lab 07/21/17 1915  NA 134*  K 4.7  CL 98*  CO2 22  GLUCOSE 260*  BUN 16  CREATININE 1.81*  CALCIUM 8.9   ------------------------------------------------------------------------------------------------------------------  Cardiac Enzymes Recent Labs  Lab 07/21/17 1915  TROPONINI 0.05*   ------------------------------------------------------------------------------------------------------------------  RADIOLOGY:  Dg Chest Port 1 View  Result Date: 07/21/2017 CLINICAL DATA:  Male with hypoxia and possible overdose. EXAM: PORTABLE CHEST 1 VIEW COMPARISON:  None. FINDINGS: Diffuse hazy airspace density primarily involving the left lung may represent asymmetric edema, ARDS, or pneumonia. Clinical correlation is recommended. No lobar consolidation, pleural effusion, or pneumothorax. The cardiac silhouette is within normal limits. No acute osseous pathology. IMPRESSION: Findings may represent asymmetric edema, ARDS or pneumonia. Clinical correlation is recommended. Electronically Signed   By: Elgie Collard M.D.   On: 07/21/2017 19:49    EKG:  No orders found for this or any previous visit.  IMPRESSION AND PLAN:  Principal Problem:   Acute respiratory failure with hypoxia and hypercapnia (HCC) -since oxygenation improved initially on BiPAP, however he was still very restless and trying to remove the mask requiring 2 people constantly at bedside to prevent him from doing so.  His chest x-ray is also strongly suggestive of infectious pneumonia process versus ARDS.  Given his tenuous respiratory status any sedation to keep him from removing his BiPAP mask will likely result in respiratory depression and require intubation anyway.  Therefore the decision was made to intubate the patient in order to better treat his medical conditions. Active  Problems:   Sepsis (HCC) -broad-spectrum antibiotics in place, lactic acid was significantly elevated at 7.  We will use IV fluids and continue to trend his lactate until it is within normal limits.  Blood and urine cultures sent.  Patient is hemodynamically stable   Drug overdose -unclear what exactly he may have used, however it was likely in IV form.  Urine tox is positive for cocaine only, however this does not preclude the use of some other narcotics that do not regularly show up on tox screens.   Aspiration pneumonia (HCC) -versus ARDS, or potentially causing ARDS.  Patient is being intubated, IV antibiotics in place, we will transfer him to the ICU   AKI (acute kidney injury) (HCC) -IV fluids, avoid nephrotoxins and monitor for improvement  All the records are reviewed and case discussed with ED provider. Management plans discussed with the patient and/or family.  DVT PROPHYLAXIS: SubQ heparin  GI PROPHYLAXIS: H2 blocker  ADMISSION STATUS: Inpatient  CODE STATUS: Full Code Status History    This patient does not have a recorded code status. Please follow your organizational policy for patients in this situation.      TOTAL CRITICAL CARE TIME TAKING CARE OF THIS PATIENT: 50 minutes.   Shigeru Lampert FIELDING 07/21/2017, 8:43 PM  [employer]  [phone]  CC: Primary care physician; No primary care provider on file.  Note:  This document was prepared using Dragon voice recognition software and may include unintentional dictation errors.

## 2017-07-22 ENCOUNTER — Inpatient Hospital Stay: Payer: BLUE CROSS/BLUE SHIELD

## 2017-07-22 DIAGNOSIS — J9600 Acute respiratory failure, unspecified whether with hypoxia or hypercapnia: Secondary | ICD-10-CM

## 2017-07-22 DIAGNOSIS — F1410 Cocaine abuse, uncomplicated: Secondary | ICD-10-CM

## 2017-07-22 LAB — BLOOD CULTURE ID PANEL (REFLEXED)
ACINETOBACTER BAUMANNII: NOT DETECTED
CANDIDA KRUSEI: NOT DETECTED
CANDIDA PARAPSILOSIS: NOT DETECTED
Candida albicans: NOT DETECTED
Candida glabrata: NOT DETECTED
Candida tropicalis: NOT DETECTED
ENTEROCOCCUS SPECIES: NOT DETECTED
ESCHERICHIA COLI: NOT DETECTED
Enterobacter cloacae complex: NOT DETECTED
Enterobacteriaceae species: NOT DETECTED
HAEMOPHILUS INFLUENZAE: NOT DETECTED
KLEBSIELLA OXYTOCA: NOT DETECTED
Klebsiella pneumoniae: NOT DETECTED
LISTERIA MONOCYTOGENES: NOT DETECTED
METHICILLIN RESISTANCE: DETECTED — AB
Neisseria meningitidis: NOT DETECTED
PROTEUS SPECIES: NOT DETECTED
Pseudomonas aeruginosa: NOT DETECTED
SERRATIA MARCESCENS: NOT DETECTED
STAPHYLOCOCCUS AUREUS BCID: NOT DETECTED
STAPHYLOCOCCUS SPECIES: DETECTED — AB
Streptococcus agalactiae: NOT DETECTED
Streptococcus pneumoniae: NOT DETECTED
Streptococcus pyogenes: NOT DETECTED
Streptococcus species: NOT DETECTED

## 2017-07-22 LAB — COMPREHENSIVE METABOLIC PANEL
ALK PHOS: 48 U/L (ref 38–126)
ALT: 72 U/L — ABNORMAL HIGH (ref 17–63)
ANION GAP: 5 (ref 5–15)
AST: 130 U/L — ABNORMAL HIGH (ref 15–41)
Albumin: 3.2 g/dL — ABNORMAL LOW (ref 3.5–5.0)
BILIRUBIN TOTAL: 0.8 mg/dL (ref 0.3–1.2)
BUN: 16 mg/dL (ref 6–20)
CALCIUM: 7.7 mg/dL — AB (ref 8.9–10.3)
CO2: 27 mmol/L (ref 22–32)
Chloride: 104 mmol/L (ref 101–111)
Creatinine, Ser: 1.15 mg/dL (ref 0.61–1.24)
GFR calc non Af Amer: >60 mL/min (ref >60)
Glucose, Bld: 101 mg/dL — ABNORMAL HIGH (ref 65–99)
POTASSIUM: 4.7 mmol/L (ref 3.5–5.1)
SODIUM: 136 mmol/L (ref 135–145)
TOTAL PROTEIN: 5.8 g/dL — AB (ref 6.5–8.1)

## 2017-07-22 LAB — BLOOD GAS, ARTERIAL
Acid-base deficit: 5.2 mmol/L — ABNORMAL HIGH (ref 0.0–2.0)
BICARBONATE: 24.2 mmol/L (ref 20.0–28.0)
FIO2: 0.75
LHR: 22 {breaths}/min
O2 Saturation: 89.7 %
PATIENT TEMPERATURE: 37
PEEP/CPAP: 5 cmH2O
PO2 ART: 71 mmHg — AB (ref 83.0–108.0)
VT: 500 mL
pCO2 arterial: 62 mmHg — ABNORMAL HIGH (ref 32.0–48.0)
pH, Arterial: 7.2 — ABNORMAL LOW (ref 7.350–7.450)

## 2017-07-22 LAB — CBC
HEMATOCRIT: 42.9 % (ref 40.0–52.0)
HEMOGLOBIN: 14.2 g/dL (ref 13.0–18.0)
MCH: 29.1 pg (ref 26.0–34.0)
MCHC: 33.1 g/dL (ref 32.0–36.0)
MCV: 88 fL (ref 80.0–100.0)
Platelets: 158 10*3/uL (ref 150–440)
RBC: 4.87 MIL/uL (ref 4.40–5.90)
RDW: 14.2 % (ref 11.5–14.5)
WBC: 11.5 10*3/uL — ABNORMAL HIGH (ref 3.8–10.6)

## 2017-07-22 LAB — TROPONIN I
TROPONIN I: 0.33 ng/mL — AB (ref <0.03)
Troponin I: 0.37 ng/mL (ref <0.03)
Troponin I: 0.38 ng/mL (ref <0.03)

## 2017-07-22 LAB — GLUCOSE, CAPILLARY
GLUCOSE-CAPILLARY: 104 mg/dL — AB (ref 65–99)
GLUCOSE-CAPILLARY: 113 mg/dL — AB (ref 65–99)
Glucose-Capillary: 100 mg/dL — ABNORMAL HIGH (ref 65–99)
Glucose-Capillary: 116 mg/dL — ABNORMAL HIGH (ref 65–99)
Glucose-Capillary: 83 mg/dL (ref 65–99)
Glucose-Capillary: 85 mg/dL (ref 65–99)

## 2017-07-22 LAB — PROCALCITONIN: Procalcitonin: 7.41 ng/mL

## 2017-07-22 LAB — LACTIC ACID, PLASMA
LACTIC ACID, VENOUS: 1.7 mmol/L (ref 0.5–1.9)
LACTIC ACID, VENOUS: 2.1 mmol/L — AB (ref 0.5–1.9)

## 2017-07-22 LAB — MAGNESIUM: Magnesium: 1.8 mg/dL (ref 1.7–2.4)

## 2017-07-22 LAB — MRSA PCR SCREENING: MRSA BY PCR: NEGATIVE

## 2017-07-22 LAB — PHOSPHORUS: PHOSPHORUS: 2.7 mg/dL (ref 2.5–4.6)

## 2017-07-22 LAB — FIBRINOGEN: Fibrinogen: 307 mg/dL (ref 210–475)

## 2017-07-22 LAB — APTT: aPTT: 28 seconds (ref 24–36)

## 2017-07-22 LAB — PROTIME-INR
INR: 1.1
Prothrombin Time: 14.1 seconds (ref 11.4–15.2)

## 2017-07-22 LAB — FIBRIN DERIVATIVES D-DIMER (ARMC ONLY)

## 2017-07-22 MED ORDER — VECURONIUM BROMIDE 10 MG IV SOLR
INTRAVENOUS | Status: AC
  Filled 2017-07-22: qty 10

## 2017-07-22 MED ORDER — VANCOMYCIN HCL 10 G IV SOLR
1250.0000 mg | Freq: Once | INTRAVENOUS | Status: AC
  Administered 2017-07-22: 1250 mg via INTRAVENOUS
  Filled 2017-07-22: qty 1250

## 2017-07-22 MED ORDER — PIPERACILLIN-TAZOBACTAM 3.375 G IVPB
3.3750 g | Freq: Three times a day (TID) | INTRAVENOUS | Status: DC
  Administered 2017-07-22 – 2017-07-24 (×7): 3.375 g via INTRAVENOUS
  Filled 2017-07-22 (×7): qty 50

## 2017-07-22 MED ORDER — MIDAZOLAM HCL 2 MG/2ML IJ SOLN
2.0000 mg | Freq: Once | INTRAMUSCULAR | Status: AC
  Administered 2017-07-22: 2 mg via INTRAVENOUS

## 2017-07-22 MED ORDER — ORAL CARE MOUTH RINSE
15.0000 mL | OROMUCOSAL | Status: DC
  Administered 2017-07-23 (×7): 15 mL via OROMUCOSAL

## 2017-07-22 MED ORDER — SODIUM CHLORIDE 0.9 % IV SOLN
0.0000 ug/min | INTRAVENOUS | Status: DC
  Administered 2017-07-22: 50 ug/min via INTRAVENOUS
  Filled 2017-07-22: qty 1

## 2017-07-22 MED ORDER — SODIUM CHLORIDE 0.9 % IV SOLN
INTRAVENOUS | Status: DC
  Administered 2017-07-23: 17:00:00 via INTRAVENOUS

## 2017-07-22 MED ORDER — VECURONIUM BROMIDE 10 MG IV SOLR
INTRAVENOUS | Status: AC
  Administered 2017-07-22: 10 mg
  Filled 2017-07-22: qty 10

## 2017-07-22 MED ORDER — VECURONIUM BROMIDE 10 MG IV SOLR: 10.0000 mg | INTRAVENOUS | Status: DC | PRN

## 2017-07-22 MED ORDER — PHENYLEPHRINE HCL 10 MG/ML IJ SOLN
0.0000 ug/min | Freq: Once | INTRAVENOUS | Status: DC
  Filled 2017-07-22: qty 1

## 2017-07-22 MED ORDER — SODIUM CHLORIDE 0.9 % IV SOLN
Freq: Once | INTRAVENOUS | Status: AC
  Administered 2017-07-22: 1000 mL via INTRAVENOUS

## 2017-07-22 MED ORDER — SODIUM CHLORIDE 0.9 % IV SOLN
0.0000 ug/min | Freq: Once | INTRAVENOUS | Status: AC
  Administered 2017-07-22: 20 ug/min via INTRAVENOUS
  Filled 2017-07-22: qty 10

## 2017-07-22 MED ORDER — STERILE WATER FOR INJECTION IJ SOLN
INTRAMUSCULAR | Status: AC
  Filled 2017-07-22: qty 10

## 2017-07-22 MED ORDER — VANCOMYCIN HCL 10 G IV SOLR
1250.0000 mg | Freq: Two times a day (BID) | INTRAVENOUS | Status: DC
  Administered 2017-07-22 (×2): 1250 mg via INTRAVENOUS
  Filled 2017-07-22 (×3): qty 1250

## 2017-07-22 MED ORDER — VECURONIUM BROMIDE 10 MG IV SOLR: 10.0000 mg | Freq: Once | INTRAVENOUS | Status: DC

## 2017-07-22 MED ORDER — DEXMEDETOMIDINE HCL IN NACL 400 MCG/100ML IV SOLN
0.4000 ug/kg/h | INTRAVENOUS | Status: DC
  Administered 2017-07-22: 1 ug/kg/h via INTRAVENOUS
  Administered 2017-07-22 – 2017-07-23 (×6): 1.2 ug/kg/h via INTRAVENOUS
  Filled 2017-07-22 (×10): qty 100

## 2017-07-22 NOTE — Progress Notes (Addendum)
Pharmacy Antibiotic Note  Allen Bass is a 41 y.o. male admitted on 07/21/2017 with sepsis.  Pharmacy has been consulted for vancomycin and Zosyn dosing.  Plan: DW 92kg  Vd 64L kei 0.061 hr-1  T1/2 11 hours. Vancomycin 1250 mg q 12 hours ordered with stacked dosing. Level before 4th dose to ensure clearance as renal function appears somewhat labile. Goal trough 15-20  Pt renal function is improving. Could consider changing dose, however with patient size, he is at risk for accumulation. Therefore I will leave the dose the same and check a level prior to the 4th dose.  Zosyn 3.375g IV q8h (4 hour infusion).  Height: 6' (182.9 cm) Weight: 250 lb (113.4 kg) IBW/kg (Calculated) : 77.6  Temp (24hrs), Avg:100.1 F (37.8 C), Min:94.3 F (34.6 C), Max:100.9 F (38.3 C)  Recent Labs  Lab 07/21/17 1915 07/21/17 1930 07/22/17 0101 07/22/17 0426  WBC 17.9*  --   --  11.5*  CREATININE 1.81*  --   --  1.15  LATICACIDVEN  --  7.2* 1.7 2.1*    Estimated Creatinine Clearance: 107.7 mL/min (by C-G formula based on SCr of 1.15 mg/dL).    No Known Allergies  Antimicrobials this admission: Azithromycin 12/28; zosyn vancomycin 12/29  >>    >>   Dose adjustments this admission:   Microbiology results: 12/28 BCx: BCID 1/4 Staph spp. mecA(+) 12/28 UCx: pending  12/29 Sputum: pending  12/28 MRSA PCR: (-)      12/28 UA: LE(-) NO2(-)  WBC 6-30 12/28 CXR: Findings may represent asymmetric edema, ARDS or pneumonia Thank you for allowing pharmacy to be a part of this patient's care.  Olene FlossMelissa D Maccia 07/22/2017 10:45 AM

## 2017-07-22 NOTE — Progress Notes (Deleted)
Critical Lactic acid called to M. Luci Bankukov, NP.  IVF increased to 150 ml/hr

## 2017-07-22 NOTE — Progress Notes (Signed)
Patients Cell phone and Keys given to mother.

## 2017-07-22 NOTE — Consult Note (Signed)
   Name: Allen Bass MRN: [id number] DOB: March 13, 1976     CONSULTATION DATE: 07/21/2017  REFERRING MD : Juliene PinaMody  CHIEF COMPLAINT:  resp failure   HISTORY OF PRESENT ILLNESS:   41 yo white male admitted to ICU for acute resp failure and on vent support Patient now intubated, sedated Full vent support fio2 at 75% PEEP at 8  PAST MEDICAL HISTORY :  +DRUG ABUSE Prior to Admission medications   Not on File   No Known Allergies  FAMILY HISTORY:  +HTH SOCIAL HISTORY:  reports that he uses drugs. Drugs: Cocaine and Heroin.  ROS unobtainable due to critical illness    VITAL SIGNS: Temp:  [94.3 F (34.6 C)-100.9 F (38.3 C)] 100.2 F (37.9 C) (12/29 0800) Pulse Rate:  [79-133] 79 (12/29 0800) Resp:  [0-26] 25 (12/29 0800) BP: (62-154)/(49-132) 114/82 (12/29 0800) SpO2:  [96 %-100 %] 100 % (12/29 0800) FiO2 (%):  [30 %-100 %] 65 % (12/29 0744) Weight:  [250 lb (113.4 kg)] 250 lb (113.4 kg) (12/28 1954)  Physical Examination:  GENERAL:critically ill appearing, +resp distress HEAD: Normocephalic, atraumatic.  EYES: Pupils equal, round, reactive to light.  No scleral icterus.  MOUTH: Moist mucosal membrane. NECK: Supple. No thyromegaly. No nodules. No JVD.  PULMONARY: +rhonchi, +wheezing CARDIOVASCULAR: S1 and S2. Regular rate and rhythm. No murmurs, rubs, or gallops.  GASTROINTESTINAL: Soft, nontender, -distended. No masses. Positive bowel sounds. No hepatosplenomegaly.  MUSCULOSKELETAL: No swelling, clubbing, or edema.  NEUROLOGIC: obtunded SKIN:intact,warm,dry       Recent Labs  Lab 07/21/17 1915 07/22/17 0426  NA 134* 136  K 4.7 4.7  CL 98* 104  CO2 22 27  BUN 16 16  CREATININE 1.81* 1.15  GLUCOSE 260* 101*   Recent Labs  Lab 07/21/17 1915 07/22/17 0426  HGB 16.6 14.2  HCT 50.5 42.9  WBC 17.9* 11.5*  PLT 279 158    ASSESSMENT / PLAN: 41 yo white male with severe resp failure with ALI from cocaine abuse and multifocal opacities  pneumonia/CHF on full vent support  1.continue vent support 2.sedation as needed 3.abx 4.BD therapy 5.titrate fio2 as needed  Will update family when they arrive    Critical Care Time devoted to patient care services described in this note is 55 minutes.   Overall, patient is critically ill, prognosis is guarded.  Patient with Multiorgan failure and at high risk for cardiac arrest and death.    Lucie LeatherKurian David Diondre Pulis, M.D.  [facility]  Medical Director [facility] Medical Director [facility] Cardio-Pulmonary Department

## 2017-07-22 NOTE — Progress Notes (Signed)
Critical Lactic acid called to M. Luci Bankukov, NP.  NS IVF bolus ordered

## 2017-07-22 NOTE — Progress Notes (Signed)
[facility] [place] at [facility]   PATIENT NAME: Allen Bass    MR#:  [id number]  DATE OF BIRTH:  September 20, 1975  SUBJECTIVE:   Patient intubated on propofol and fentanyl  REVIEW OF SYSTEMS:    Unable to obtain patient intubated and sedated   Tolerating Diet: Nothing by mouth      DRUG ALLERGIES:  No Known Allergies  VITALS:  Blood pressure 114/82, pulse 79, temperature 100.2 F (37.9 C), resp. rate (!) 25, height 6' (1.829 m), weight 113.4 kg (250 lb), SpO2 100 %.  PHYSICAL EXAMINATION:  Constitutional: Appears well-developed and well-nourished. No distress. Intubated and sedated HENT: Normocephalic. Marland Kitchen. Oropharynx is clear and moist.  Eyes: Conjunctivae and EOM are normal. PERRLA, no scleral icterus.  Neck: Neck supple. No JVD. No tracheal deviation. CVS: RRR, S1/S2 +, no murmurs, no gallops, no carotid bruit.  Pulmonary: Normal affect with bilateral coarse breath sounds no stridor, rhonchi, wheezes, rales.  Abdominal: Soft. BS +,  no distension, tenderness, rebound or guarding.  Musculoskeletal:. No edema and no tenderness.  Neuro: Intubated and sedated  Skin: Skin is warm and dry. No rash noted. Psychiatric: Sedated     LABORATORY PANEL:   CBC Recent Labs  Lab 07/22/17 0426  WBC 11.5*  HGB 14.2  HCT 42.9  PLT 158   ------------------------------------------------------------------------------------------------------------------  Chemistries  Recent Labs  Lab 07/21/17 2322 07/22/17 0426  NA  --  136  K  --  4.7  CL  --  104  CO2  --  27  GLUCOSE  --  101*  BUN  --  16  CREATININE  --  1.15  CALCIUM  --  7.7*  MG 1.8  --   AST  --  130*  ALT  --  72*  ALKPHOS  --  48  BILITOT  --  0.8   ------------------------------------------------------------------------------------------------------------------  Cardiac Enzymes Recent Labs  Lab 07/21/17 1915 07/22/17 0101 07/22/17 0426  TROPONINI 0.05* 0.37* 0.38*    ------------------------------------------------------------------------------------------------------------------  RADIOLOGY:  Portable Chest Xray  Result Date: 07/22/2017 CLINICAL DATA:  Acute respiratory failure EXAM: PORTABLE CHEST 1 VIEW COMPARISON:  July 21, 2017 FINDINGS: The heart size and mediastinal contours are stable. Endotracheal tube is identified distal tip 4 cm from carina. Nasogastric tube is identified with distal tip not included on film but is at least in the stomach. There is pulmonary edema. There is interval developed consolidation of the left lung base. The visualized skeletal structures are stable. IMPRESSION: Persistent pulmonary edema, worsened compared prior exam. Interval developed consolidation of left lung base suspicious for developing pneumonia with superimposed pleural effusion. Electronically Signed   By: Sherian ReinWei-Chen  Lin M.D.   On: 07/22/2017 07:05   Dg Chest Port 1 View  Result Date: 07/21/2017 CLINICAL DATA:  Heroin overdose. EXAM: PORTABLE CHEST 1 VIEW COMPARISON:  July 21, 2017 FINDINGS: Heart size is normal. Mediastinal contour is normal. Endotracheal tube is identified with distal tip 5.8 cm from carina. Nasogastric tube is noted with distal tip not included on the film but is at least in the stomach. Patchy consolidation of bilateral lungs are noted. There is no pleural effusion. No acute abnormalities identified within the bony structures. IMPRESSION: Patchy consolidation of bilateral which may be due to asymmetric edema, ARDS or pneumonia. This is not significantly changed compared to prior exam. Endotracheal tube in good position.  There is no pneumothorax. Electronically Signed   By: Sherian ReinWei-Chen  Lin M.D.   On: 07/21/2017 21:14   Dg  Chest Port 1 View  Result Date: 07/21/2017 CLINICAL DATA:  Male with hypoxia and possible overdose. EXAM: PORTABLE CHEST 1 VIEW COMPARISON:  None. FINDINGS: Diffuse hazy airspace density primarily involving the left  lung may represent asymmetric edema, ARDS, or pneumonia. Clinical correlation is recommended. No lobar consolidation, pleural effusion, or pneumothorax. The cardiac silhouette is within normal limits. No acute osseous pathology. IMPRESSION: Findings may represent asymmetric edema, ARDS or pneumonia. Clinical correlation is recommended. Electronically Signed   By: Elgie CollardArash  Radparvar M.D.   On: 07/21/2017 19:49   Dg Abd Portable 1 View  Result Date: 07/21/2017 CLINICAL DATA:  Feeding tube placement. EXAM: PORTABLE ABDOMEN - 1 VIEW COMPARISON:  None. FINDINGS: The bowel gas pattern is normal. Nasogastric tube is identified with distal tip in the mid stomach. Patchy consolidation of the bilateral lungs are noted. IMPRESSION: Nasogastric tube is identified with distal tip in the mid stomach. Electronically Signed   By: Sherian ReinWei-Chen  Lin M.D.   On: 07/21/2017 21:16     ASSESSMENT AND PLAN:   41 year old male with cocaine abuse who presents to the emergency room after being found unresponsive.  1. Acute hypoxic/hypercarbic respiratory failure in the setting of multifocal pneumonia due to aspiration with possible ARDS/drug overdose Continue full management as per intensivist Continue Zosyn   2. Septic shock due to pneumonia Continue pressors Continue IV fluids MAP>65  3. Elevated troponin due to demand ischemia Not ACS   4. Acute kidney injury in the setting of septic shock/ATN Resolving with IV fluids  5. Cocaine abuse  Management plans discussed with intensivist CODE STATUS: Full  TOTAL TIME TAKING CARE OF THIS PATIENT: 30 minutes.     POSSIBLE D/C ??, DEPENDING ON CLINICAL CONDITION.   Miriam Liles M.D on 07/22/2017 at 9:04 AM  Between 7am to 6pm - Pager - [phone] After 6pm go to [email or web address] - Social research officer, governmentpassword EPAS [facility]  [facility] [place] [facility]  Office  [phone]  CC: Primary care physician; No primary care provider on file.  Note: This dictation was prepared with  Dragon dictation along with smaller phrase technology. Any transcriptional errors that result from this process are unintentional.

## 2017-07-22 NOTE — Progress Notes (Signed)
Bair Hugger applied

## 2017-07-22 NOTE — Progress Notes (Addendum)
Pt had episode of agitation when suctioned. He sat up in bed and reached for ETT. Given PRN versed.

## 2017-07-22 NOTE — Progress Notes (Signed)
Pharmacy Antibiotic Note  Allen Bass is a 41 y.o. male admitted on 07/21/2017 with sepsis.  Pharmacy has been consulted for vancomycin and Zosyn dosing.  Plan: DW 92kg  Vd 64L kei 0.061 hr-1  T1/2 11 hours. Vancomycin 1250 mg q 12 hours ordered with stacked dosing. Level before 4th dose to ensure clearance as renal function appears somewhat labile. Goal trough 15-20  Zosyn 3.375g IV q8h (4 hour infusion).  Height: 6' (182.9 cm) Weight: 250 lb (113.4 kg) IBW/kg (Calculated) : 77.6  Temp (24hrs), Avg:100.1 F (37.8 C), Min:94.3 F (34.6 C), Max:100.9 F (38.3 C)  Recent Labs  Lab 07/21/17 1915 07/21/17 1930 07/22/17 0101 07/22/17 0426  WBC 17.9*  --   --  11.5*  CREATININE 1.81*  --   --  1.15  LATICACIDVEN  --  7.2* 1.7 2.1*    Estimated Creatinine Clearance: 107.7 mL/min (by C-G formula based on SCr of 1.15 mg/dL).    No Known Allergies  Antimicrobials this admission: Azithromycin 12/28; zosyn vancomycin 12/29  >>    >>   Dose adjustments this admission:   Microbiology results: 12/28 BCx: pending 12/28 UCx: pending  12/29 Sputum: pending  12/28 MRSA PCR: (-)      12/28 UA: LE(-) NO2(-)  WBC 6-30 12/28 CXR: Findings may represent asymmetric edema, ARDS or pneumonia Thank you for allowing pharmacy to be a part of this patient'Bass care.  Allen Bass 07/22/2017 5:17 AM

## 2017-07-23 ENCOUNTER — Inpatient Hospital Stay: Payer: BLUE CROSS/BLUE SHIELD

## 2017-07-23 ENCOUNTER — Inpatient Hospital Stay (HOSPITAL_COMMUNITY)
Admit: 2017-07-23 | Discharge: 2017-07-23 | Disposition: A | Discharge status: Home / Self Care | Payer: BLUE CROSS/BLUE SHIELD | Attending: Internal Medicine | Admitting: Internal Medicine

## 2017-07-23 DIAGNOSIS — J9601 Acute respiratory failure with hypoxia: Secondary | ICD-10-CM

## 2017-07-23 LAB — BASIC METABOLIC PANEL
ANION GAP: 7 (ref 5–15)
BUN: 14 mg/dL (ref 6–20)
CALCIUM: 8.1 mg/dL — AB (ref 8.9–10.3)
CO2: 23 mmol/L (ref 22–32)
Chloride: 108 mmol/L (ref 101–111)
Creatinine, Ser: 0.78 mg/dL (ref 0.61–1.24)
Glucose, Bld: 125 mg/dL — ABNORMAL HIGH (ref 65–99)
Potassium: 4.1 mmol/L (ref 3.5–5.1)
SODIUM: 138 mmol/L (ref 135–145)

## 2017-07-23 LAB — ECHOCARDIOGRAM COMPLETE
HEIGHTINCHES: 72 in
WEIGHTICAEL: 3848.35 [oz_av]

## 2017-07-23 LAB — GLUCOSE, CAPILLARY
GLUCOSE-CAPILLARY: 101 mg/dL — AB (ref 65–99)
GLUCOSE-CAPILLARY: 103 mg/dL — AB (ref 65–99)
GLUCOSE-CAPILLARY: 96 mg/dL (ref 65–99)
Glucose-Capillary: 111 mg/dL — ABNORMAL HIGH (ref 65–99)
Glucose-Capillary: 112 mg/dL — ABNORMAL HIGH (ref 65–99)
Glucose-Capillary: 120 mg/dL — ABNORMAL HIGH (ref 65–99)
Glucose-Capillary: 82 mg/dL (ref 65–99)

## 2017-07-23 LAB — CBC
HEMATOCRIT: 38.3 % — AB (ref 40.0–52.0)
Hemoglobin: 13 g/dL (ref 13.0–18.0)
MCH: 29.2 pg (ref 26.0–34.0)
MCHC: 33.9 g/dL (ref 32.0–36.0)
MCV: 86 fL (ref 80.0–100.0)
PLATELETS: 144 10*3/uL — AB (ref 150–440)
RBC: 4.46 MIL/uL (ref 4.40–5.90)
RDW: 14.7 % — AB (ref 11.5–14.5)
WBC: 7 10*3/uL (ref 3.8–10.6)

## 2017-07-23 LAB — URINE CULTURE: CULTURE: NO GROWTH

## 2017-07-23 LAB — PHOSPHORUS: PHOSPHORUS: 1.7 mg/dL — AB (ref 2.5–4.6)

## 2017-07-23 LAB — PROCALCITONIN: Procalcitonin: 6.36 ng/mL

## 2017-07-23 LAB — MAGNESIUM: MAGNESIUM: 1.7 mg/dL (ref 1.7–2.4)

## 2017-07-23 MED ORDER — POTASSIUM PHOSPHATES 15 MMOLE/5ML IV SOLN
30.0000 mmol | Freq: Once | INTRAVENOUS | Status: AC
  Administered 2017-07-23: 30 mmol via INTRAVENOUS
  Filled 2017-07-23: qty 10

## 2017-07-23 MED ORDER — LORAZEPAM 2 MG/ML IJ SOLN: 0.0000 mg | Freq: Two times a day (BID) | INTRAMUSCULAR | Status: DC

## 2017-07-23 MED ORDER — LORAZEPAM 1 MG PO TABS: 1.0000 mg | ORAL_TABLET | Freq: Four times a day (QID) | ORAL | Status: DC | PRN

## 2017-07-23 MED ORDER — LORAZEPAM 2 MG/ML IJ SOLN
0.0000 mg | Freq: Four times a day (QID) | INTRAMUSCULAR | Status: DC
  Administered 2017-07-23 – 2017-07-24 (×3): 2 mg via INTRAVENOUS
  Filled 2017-07-23 (×3): qty 1

## 2017-07-23 MED ORDER — DIAZEPAM 5 MG PO TABS: 5.0000 mg | ORAL_TABLET | Freq: Four times a day (QID) | ORAL | Status: DC | PRN

## 2017-07-23 MED ORDER — CLONIDINE HCL 0.1 MG PO TABS
0.1000 mg | ORAL_TABLET | Freq: Four times a day (QID) | ORAL | Status: DC
  Administered 2017-07-23 – 2017-07-25 (×6): 0.1 mg via ORAL
  Filled 2017-07-23 (×6): qty 1

## 2017-07-23 MED ORDER — MAGNESIUM SULFATE 2 GM/50ML IV SOLN
2.0000 g | Freq: Once | INTRAVENOUS | Status: AC
  Administered 2017-07-23: 2 g via INTRAVENOUS
  Filled 2017-07-23: qty 50

## 2017-07-23 MED ORDER — FUROSEMIDE 10 MG/ML IJ SOLN
60.0000 mg | Freq: Once | INTRAMUSCULAR | Status: AC
  Administered 2017-07-23: 60 mg via INTRAVENOUS
  Filled 2017-07-23: qty 6

## 2017-07-23 MED ORDER — LORAZEPAM 2 MG/ML IJ SOLN: 1.0000 mg | Freq: Four times a day (QID) | INTRAMUSCULAR | Status: DC | PRN

## 2017-07-23 MED ORDER — METHYLPREDNISOLONE SODIUM SUCC 40 MG IJ SOLR
20.0000 mg | Freq: Two times a day (BID) | INTRAMUSCULAR | Status: DC
  Administered 2017-07-23 – 2017-07-24 (×3): 20 mg via INTRAVENOUS
  Filled 2017-07-23 (×3): qty 1

## 2017-07-23 NOTE — Progress Notes (Signed)
[facility] - [place] at [facility]   PATIENT NAME: Allen Bass    MR#:  [id number]  DATE OF BIRTH:  03-10-1976  SUBJECTIVE:   Patient intubated opening eyes. Plan to extubate today Family at bedside  REVIEW OF SYSTEMS:    Unable to obtain patient intubated   Tolerating Diet: Nothing by mouth      DRUG ALLERGIES:  No Known Allergies  VITALS:  Blood pressure 115/78, pulse 67, temperature 99.3 F (37.4 C), resp. rate 18, height 6' (1.829 m), weight 109.1 kg (240 lb 8.4 oz), SpO2 98 %.  PHYSICAL EXAMINATION:  Constitutional: Appears well-developed and well-nourished. No distress. Intubated Opening eyes HENT: Normocephalic. Marland Kitchen. Oropharynx is clear and moist.  Eyes: Conjunctivae and EOM are normal. PERRLA, no scleral icterus.  Neck: Neck supple. No JVD. No tracheal deviation. CVS: RRR, S1/S2 +, no murmurs, no gallops, no carotid bruit.  Pulmonary: Normal effort with bilateral coarse breath sounds no stridor, rhonchi, wheezes, rales.  Abdominal: Soft. BS +,  no distension, tenderness, rebound or guarding.  Musculoskeletal:. No edema and no tenderness.  Neuro: Intubated and sedated  Skin: Skin is warm and dry. No rash noted. Psychiatric: Opening eyes not following commands    LABORATORY PANEL:   CBC Recent Labs  Lab 07/23/17 0354  WBC 7.0  HGB 13.0  HCT 38.3*  PLT 144*   ------------------------------------------------------------------------------------------------------------------  Chemistries  Recent Labs  Lab 07/22/17 0426 07/23/17 0354  NA 136 138  K 4.7 4.1  CL 104 108  CO2 27 23  GLUCOSE 101* 125*  BUN 16 14  CREATININE 1.15 0.78  CALCIUM 7.7* 8.1*  MG  --  1.7  AST 130*  --   ALT 72*  --   ALKPHOS 48  --   BILITOT 0.8  --    ------------------------------------------------------------------------------------------------------------------  Cardiac Enzymes Recent Labs  Lab 07/22/17 0101 07/22/17 0426 07/22/17 1041   TROPONINI 0.37* 0.38* 0.33*   ------------------------------------------------------------------------------------------------------------------  RADIOLOGY:  Dg Chest Port 1 View  Result Date: 07/23/2017 CLINICAL DATA:  Acute respiratory failure EXAM: PORTABLE CHEST 1 VIEW COMPARISON:  07/22/2017 FINDINGS: Cardiac shadow is enlarged. Endotracheal tube and nasogastric catheter are stable. There is been significant improved aeration in the left lung although persistent infiltrative changes are noted in the left base. No pneumothorax is seen. No bony abnormality is noted. IMPRESSION: Improved aeration on the left with persistent left basilar changes. Electronically Signed   By: Alcide CleverMark  Lukens M.D.   On: 07/23/2017 07:18   Portable Chest Xray  Result Date: 07/22/2017 CLINICAL DATA:  Acute respiratory failure EXAM: PORTABLE CHEST 1 VIEW COMPARISON:  July 21, 2017 FINDINGS: The heart size and mediastinal contours are stable. Endotracheal tube is identified distal tip 4 cm from carina. Nasogastric tube is identified with distal tip not included on film but is at least in the stomach. There is pulmonary edema. There is interval developed consolidation of the left lung base. The visualized skeletal structures are stable. IMPRESSION: Persistent pulmonary edema, worsened compared prior exam. Interval developed consolidation of left lung base suspicious for developing pneumonia with superimposed pleural effusion. Electronically Signed   By: Sherian ReinWei-Chen  Lin M.D.   On: 07/22/2017 07:05   Dg Chest Port 1 View  Result Date: 07/21/2017 CLINICAL DATA:  Heroin overdose. EXAM: PORTABLE CHEST 1 VIEW COMPARISON:  July 21, 2017 FINDINGS: Heart size is normal. Mediastinal contour is normal. Endotracheal tube is identified with distal tip 5.8 cm from carina. Nasogastric tube is noted with distal tip not  included on the film but is at least in the stomach. Patchy consolidation of bilateral lungs are noted. There is no  pleural effusion. No acute abnormalities identified within the bony structures. IMPRESSION: Patchy consolidation of bilateral which may be due to asymmetric edema, ARDS or pneumonia. This is not significantly changed compared to prior exam. Endotracheal tube in good position.  There is no pneumothorax. Electronically Signed   By: Sherian ReinWei-Chen  Lin M.D.   On: 07/21/2017 21:14   Dg Chest Port 1 View  Result Date: 07/21/2017 CLINICAL DATA:  Male with hypoxia and possible overdose. EXAM: PORTABLE CHEST 1 VIEW COMPARISON:  None. FINDINGS: Diffuse hazy airspace density primarily involving the left lung may represent asymmetric edema, ARDS, or pneumonia. Clinical correlation is recommended. No lobar consolidation, pleural effusion, or pneumothorax. The cardiac silhouette is within normal limits. No acute osseous pathology. IMPRESSION: Findings may represent asymmetric edema, ARDS or pneumonia. Clinical correlation is recommended. Electronically Signed   By: Elgie CollardArash  Radparvar M.D.   On: 07/21/2017 19:49   Dg Abd Portable 1 View  Result Date: 07/21/2017 CLINICAL DATA:  Feeding tube placement. EXAM: PORTABLE ABDOMEN - 1 VIEW COMPARISON:  None. FINDINGS: The bowel gas pattern is normal. Nasogastric tube is identified with distal tip in the mid stomach. Patchy consolidation of the bilateral lungs are noted. IMPRESSION: Nasogastric tube is identified with distal tip in the mid stomach. Electronically Signed   By: Sherian ReinWei-Chen  Lin M.D.   On: 07/21/2017 21:16     ASSESSMENT AND PLAN:   41 year old male with cocaine abuse who presents to the emergency room after being found unresponsive.  1. Acute hypoxic/hypercarbic respiratory failure in the setting of multifocal pneumonia due to aspiration with possible ARDS/drug overdose Plan to extubate later this morning.  Continue Zosyn and azithromycin 1 dose of IV Lasix given this morning  2. Septic shock due to pneumonia Patient off of pressors Continue IV fluids Keep  MAP>65  3. Elevated troponin due to demand ischemia Not ACS   4. Acute kidney injury in the setting of septic shock/ATN Resolved with IV fluids  5. Cocaine abuse  Management plans discussed with intensivist and family CODE STATUS: Full  TOTAL TIME TAKING CARE OF THIS PATIENT: 29 minutes.     POSSIBLE D/C ??, DEPENDING ON CLINICAL CONDITION.   Allen Bass M.D on 07/23/2017 at 8:47 AM  Between 7am to 6pm - Pager - [phone] After 6pm go to [email or web address] - Social research officer, governmentpassword EPAS [facility]  [facility] [place] [facility]  Office  [phone]  CC: Primary care physician; No primary care provider on file.  Note: This dictation was prepared with Dragon dictation along with smaller phrase technology. Any transcriptional errors that result from this process are unintentional.

## 2017-07-23 NOTE — Progress Notes (Signed)
Pt extubated without complications, very agitated but following commands

## 2017-07-23 NOTE — Progress Notes (Signed)
Pt extubated with Dr. Belia HemanKasa at bedside and RRT Peyton NajjarLarry.  Pt tolerated well.  Sedation medications stopped.  Resp  Even and unlabored.  Pt placed on [employer] @5lpm .

## 2017-07-23 NOTE — Consult Note (Signed)
   Name: Allen Quarryimothy E Dinsmore MRN: [id number] DOB: 05-18-1976     CONSULTATION DATE: 07/21/2017  REFERRING MD : Juliene PinaMody  CHIEF COMPLAINT:  resp failure   HISTORY OF PRESENT ILLNESS:   Remains intubated,sedated Very agitated last night fio2 at 45% PEEP at 8  abx adjusted Remains critically ill  ROS unobtainable due to critical illness    VITAL SIGNS: Temp:  [96.4 F (35.8 C)-100.4 F (38 C)] 99.3 F (37.4 C) (12/30 0600) Pulse Rate:  [61-79] 67 (12/30 0600) Resp:  [0-25] 18 (12/30 0600) BP: (101-119)/(69-96) 115/78 (12/30 0600) SpO2:  [96 %-100 %] 98 % (12/30 0600) FiO2 (%):  [45 %-55 %] 45 % (12/30 0321) Weight:  [240 lb 8.4 oz (109.1 kg)-250 lb 0 oz (113.4 kg)] 240 lb 8.4 oz (109.1 kg) (12/30 0500)  Physical Examination:  GENERAL:critically ill appearing, +resp distress HEAD: Normocephalic, atraumatic.  EYES: Pupils equal, round, reactive to light.  No scleral icterus.  MOUTH: Moist mucosal membrane. NECK: Supple. No thyromegaly. No nodules. No JVD.  PULMONARY: +rhonchi, +wheezing CARDIOVASCULAR: S1 and S2. Regular rate and rhythm. No murmurs, rubs, or gallops.  GASTROINTESTINAL: Soft, nontender, -distended. No masses. Positive bowel sounds. No hepatosplenomegaly.  MUSCULOSKELETAL: No swelling, clubbing, or edema.  NEUROLOGIC: obtunded SKIN:intact,warm,dry       Recent Labs  Lab 07/21/17 1915 07/22/17 0426 07/23/17 0354  NA 134* 136 138  K 4.7 4.7 4.1  CL 98* 104 108  CO2 22 27 23   BUN 16 16 14   CREATININE 1.81* 1.15 0.78  GLUCOSE 260* 101* 125*   Recent Labs  Lab 07/21/17 1915 07/22/17 0426 07/23/17 0354  HGB 16.6 14.2 13.0  HCT 50.5 42.9 38.3*  WBC 17.9* 11.5* 7.0  PLT 279 158 144*    ASSESSMENT / PLAN: 41 yo white male with severe resp failure with ALI from cocaine abuse and multifocal opacities pneumonia/CHF on full vent support  1.continue vent support 2.sedation as needed 3.abx adjusted 4.BD therapy 5.titrate fio2 as needed 6.IV  lasix ordered  Will update family when they arrive    Critical Care Time devoted to patient care services described in this note is 35 minutes.   Overall, patient is critically ill, prognosis is guarded.  Patient with Multiorgan failure and at high risk for cardiac arrest and death.   Unable to wean vent at this time, also concerning for anoxic brain injury-will consider CT head   Lucie LeatherKurian David Aizen Duval, M.D.  [facility]  Medical Director [facility] Medical Director [facility] Card[facility]

## 2017-07-24 ENCOUNTER — Encounter: Payer: Self-pay | Admitting: Physician Assistant

## 2017-07-24 DIAGNOSIS — J690 Pneumonitis due to inhalation of food and vomit: Secondary | ICD-10-CM

## 2017-07-24 DIAGNOSIS — I248 Other forms of acute ischemic heart disease: Secondary | ICD-10-CM

## 2017-07-24 DIAGNOSIS — A419 Sepsis, unspecified organism: Principal | ICD-10-CM

## 2017-07-24 DIAGNOSIS — T50901A Poisoning by unspecified drugs, medicaments and biological substances, accidental (unintentional), initial encounter: Secondary | ICD-10-CM

## 2017-07-24 DIAGNOSIS — J9601 Acute respiratory failure with hypoxia: Secondary | ICD-10-CM

## 2017-07-24 DIAGNOSIS — I420 Dilated cardiomyopathy: Secondary | ICD-10-CM

## 2017-07-24 LAB — CULTURE, RESPIRATORY: CULTURE: NORMAL

## 2017-07-24 LAB — GLUCOSE, CAPILLARY
GLUCOSE-CAPILLARY: 119 mg/dL — AB (ref 65–99)
GLUCOSE-CAPILLARY: 111 mg/dL — AB (ref 65–99)
GLUCOSE-CAPILLARY: 123 mg/dL — AB (ref 65–99)
GLUCOSE-CAPILLARY: 103 mg/dL — AB (ref 65–99)
Glucose-Capillary: 131 mg/dL — ABNORMAL HIGH (ref 65–99)

## 2017-07-24 LAB — CULTURE, BLOOD (ROUTINE X 2): SPECIAL REQUESTS: ADEQUATE

## 2017-07-24 LAB — PROCALCITONIN: Procalcitonin: 3.06 ng/mL

## 2017-07-24 LAB — CULTURE, RESPIRATORY W GRAM STAIN

## 2017-07-24 MED ORDER — LISINOPRIL 5 MG PO TABS: 5.0000 mg | ORAL_TABLET | Freq: Every day | ORAL | Status: DC

## 2017-07-24 MED ORDER — FAMOTIDINE 20 MG PO TABS
20.0000 mg | ORAL_TABLET | Freq: Two times a day (BID) | ORAL | Status: DC
  Administered 2017-07-24 – 2017-07-25 (×2): 20 mg via ORAL
  Filled 2017-07-24 (×2): qty 1

## 2017-07-24 MED ORDER — SPIRONOLACTONE 25 MG PO TABS
12.5000 mg | ORAL_TABLET | Freq: Every day | ORAL | Status: DC
  Administered 2017-07-24 – 2017-07-25 (×2): 12.5 mg via ORAL
  Filled 2017-07-24 (×2): qty 1
  Filled 2017-07-24 (×2): qty 0.5

## 2017-07-24 MED ORDER — METOPROLOL TARTRATE 25 MG PO TABS
25.0000 mg | ORAL_TABLET | Freq: Two times a day (BID) | ORAL | Status: DC
Start: 2017-07-24 — End: 2017-07-24

## 2017-07-24 MED ORDER — METOPROLOL SUCCINATE ER 25 MG PO TB24
12.5000 mg | ORAL_TABLET | Freq: Every day | ORAL | Status: DC
  Administered 2017-07-25: 12.5 mg via ORAL
  Filled 2017-07-24: qty 1

## 2017-07-24 MED ORDER — AMOXICILLIN-POT CLAVULANATE 875-125 MG PO TABS
1.0000 | ORAL_TABLET | Freq: Two times a day (BID) | ORAL | Status: DC
  Administered 2017-07-24 – 2017-07-25 (×3): 1 via ORAL
  Filled 2017-07-24 (×3): qty 1

## 2017-07-24 MED ORDER — LOSARTAN POTASSIUM 25 MG PO TABS: 12.5000 mg | ORAL_TABLET | Freq: Every day | ORAL | Status: DC

## 2017-07-24 MED ORDER — IPRATROPIUM-ALBUTEROL 0.5-2.5 (3) MG/3ML IN SOLN: 3.0000 mL | Freq: Four times a day (QID) | RESPIRATORY_TRACT | Status: DC | PRN

## 2017-07-24 NOTE — Care Management Note (Addendum)
Case Management Note  Patient Details  Name: Allen Bass MRN: [id number] Date of Birth: [birth year]  Subjective/Objective:                  Admitted to [facility] with the diagnosis of acute respiratory failure. Placed on Ventilator and sent to ICU. Transferred from ICU to 1C 07/23/17. Mother is Delray AltMargie [phone]). Mother and father are both at the bedside. States he lives alone, then later says he has a roommate. States [place] moved [place] 6 months ago. No primary care physician. 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 States he does have insurance.  Copy of BCBS card faxed to [employer] Means in insurance.  No primary care physician.  Spoke with father. States his son has had one other episode that this one. "He had to go to rehab."   Action/Plan: Application to [facility] and Medication Management given  Expected Discharge Date:                  Expected Discharge Plan:     In-House Referral:   yes  Discharge planning Services     Post Acute Care Choice:    Choice offered to:     DME Arranged:    DME Agency:     HH Arranged:    HH Agency:     Status of Service:     If discussed at [employer] of [employer] Meetings, dates discussed:    Additional Comments:  Gwenette GreetBrenda S Brennyn Ortlieb, RN MSN [facility] Care Management [phone] 07/24/2017, 9:13 AM

## 2017-07-24 NOTE — Evaluation (Signed)
Physical Therapy Evaluation Patient Details Name: Allen Bass MRN: [id number] DOB: [birth year] Today's Date: 07/24/2017   History of Present Illness  Pt is a41 y.o.malewho presents with acute respiratory failure due to likely overdose versus sedation from substance abuse and subsequent aspiration pneumonia.Patient was found by his roommate on the couch unresponsive surrounded by some blood and IV drug paraphernalia. Patient's roommate does state that he has a history of using heroin and other narcotics as well as cocaine.His urine tox screen here is positive for cocaine. He presented to the ED in respiratory failure. He stabilized some on BiPAP initially, but neurologically he was still confusedand trying to remove the BiPAP mask. He would open his eyes and has some purposeful movements but does not follow commands. Chest x-ray was suggestive of diffuse pneumonia versus early ARDS. Decision was made to intubate the patient 07/21/17 in order to better treat him and pt then extubated 12/30.  Assessment includes:  acute hypoxic/hypercarbic respiratory failure in the setting of multifocal pneumonia due to aspiration, septic shock due to PNA, elevated troponin due to demand ischemia, new onset systolic CHF with EF 30-35%, acute kidney injury, and cocaine abuse.     Clinical Impression  Pt presents with min deficits in strength, transfers, and gait and mod deficits activity tolerance.  Pt Ind with bed mobility tasks and SBA with transfers.  Pt able to amb 1 x 20' and 1 x 100' without AD with SBA, short B step length, and wide BOS.  Pt steady with amb without LOB but BLEs fatigued quickly during amb and were minimally tremulous upon return to room.  Pt reported no adverse symptoms during session other than general muscle fatigue and pt's HR and SpO2 were WNL throughout session.  Pt will benefit from HHPT services upon discharge to safely address above deficits for decreased caregiver  assistance and eventual return to PLOF.          Follow Up Recommendations Home health PT    Equipment Recommendations  None recommended by PT    Recommendations for Other Services       Precautions / Restrictions Precautions Precautions: None Restrictions Weight Bearing Restrictions: No      Mobility  Bed Mobility Overal bed mobility: Independent                Transfers Overall transfer level: Needs assistance Equipment used: None Transfers: Sit to/from Stand Sit to Stand: Supervision         General transfer comment: Pt steady with transfers with good control and speed  Ambulation/Gait Ambulation/Gait assistance: Supervision Ambulation Distance (Feet): 100 Feet Assistive device: None Gait Pattern/deviations: Step-through pattern;Decreased step length - right;Decreased step length - left;Wide base of support   Gait velocity interpretation: Below normal speed for age/gender General Gait Details: Pt steady during amb including 180 deg turns but with decreased cadence and wide BOS  Stairs Stairs: (Deferred)          Wheelchair Mobility    Modified Rankin (Stroke Patients Only)       Balance Overall balance assessment: Independent                                           Pertinent Vitals/Pain Pain Assessment: No/denies pain    Home Living Family/patient expects to be discharged to:: Private residence Living Arrangements: Non-relatives/Friends(Roommate) Available Help at Discharge: Friend(s);Available PRN/intermittently Type of Home:  House Home Access: Stairs to enter Entrance Stairs-Rails: Right;Left;Can reach both Secretary/administratorntrance Stairs-Number of Steps: 6 Home Layout: One level Home Equipment: None      Prior Function Level of Independence: Independent         Comments: Pt Ind with amb without AD community distances without fall history, self imployed, Ind with all ADLs and IADLs     Hand Dominance         Extremity/Trunk Assessment   Upper Extremity Assessment Upper Extremity Assessment: Overall WFL for tasks assessed    Lower Extremity Assessment Lower Extremity Assessment: Generalized weakness       Communication   Communication: No difficulties  Cognition Arousal/Alertness: Awake/alert Behavior During Therapy: WFL for tasks assessed/performed Overall Cognitive Status: Within Functional Limits for tasks assessed                                 General Comments: Pt A&O x 4 but required minimally increased time and occasional extra cues to follow some commands      General Comments General comments (skin integrity, edema, etc.): SLS test >10 sec on each LE; steady with feet apart and together with eyes closed and during head turns     Exercises Total Joint Exercises Ankle Circles/Pumps: AROM;Both;10 reps Quad Sets: Strengthening;Both;10 reps Gluteal Sets: Strengthening;Both;10 reps Heel Slides: AROM;Both;10 reps Hip ABduction/ADduction: AROM;Both;10 reps Straight Leg Raises: AROM;Both;10 reps Long Arc Quad: AROM;Both;10 reps Knee Flexion: AROM;Both;10 reps Marching in Standing: AROM;Both;10 reps Bridges: Both;10 reps;Strengthening Other Exercises Other Exercises: HEP education per handout   Assessment/Plan    PT Assessment Patient needs continued PT services  PT Problem List Decreased strength;Decreased activity tolerance       PT Treatment Interventions Gait training;Stair training;Therapeutic exercise;Therapeutic activities;Balance training;Neuromuscular re-education;Patient/family education    PT Goals (Current goals can be found in the Care Plan section)  Acute Rehab PT Goals Patient Stated Goal: Improved endurance PT Goal Formulation: With patient Time For Goal Achievement: 08/06/17 Potential to Achieve Goals: Good    Frequency Min 2X/week   Barriers to discharge        Co-evaluation               AM-PAC PT "6 Clicks" Daily  Activity  Outcome Measure Difficulty turning over in bed (including adjusting bedclothes, sheets and blankets)?: None Difficulty moving from lying on back to sitting on the side of the bed? : None Difficulty sitting down on and standing up from a chair with arms (e.g., wheelchair, bedside commode, etc,.)?: None Help needed moving to and from a bed to chair (including a wheelchair)?: None Help needed walking in hospital room?: A Little Help needed climbing 3-5 steps with a railing? : A Little 6 Click Score: 22    End of Session Equipment Utilized During Treatment: Gait belt Activity Tolerance: Patient tolerated treatment well Patient left: in bed;with call bell/phone within reach;with family/visitor present Nurse Communication: Mobility status PT Visit Diagnosis: Muscle weakness (generalized) (M62.81);Difficulty in walking, not elsewhere classified (R26.2)    Time: [id number] PT Time Calculation (min) (ACUTE ONLY): 31 min   Charges:   PT Evaluation $PT Eval Low Complexity: 1 Low PT Treatments $Therapeutic Exercise: 8-22 mins   PT G Codes:   PT G-Codes **NOT FOR INPATIENT CLASS** Functional Assessment Tool Used: AM-PAC 6 Clicks Basic Mobility Functional Limitation: Mobility: Walking and moving around Mobility: Walking and Moving Around Current Status ([id number](G8978): At least 20 percent but less  than 40 percent impaired, limited or restricted Mobility: Walking and Moving Around Goal Status [phone](G8979): 0 percent impaired, limited or restricted    D. Elly ModenaScott Oisin Yoakum PT, DPT 07/24/17, 12:49 PM

## 2017-07-24 NOTE — Consult Note (Signed)
Cardiology Consultation:   Patient ID: Allen Bass; [id number]; September 04, 1975   Admit date: 07/21/2017 Date of Consult: 07/24/2017  Primary Care Provider: No primary care provider on file. Primary Cardiologist: [facility] - consult by Gollan   Patient Profile:   Allen Bass is a 41 y.o. male with a hx of polysubstance abuse with ongoing cocaine, heroin, marijuana, and tobacco abuse with prior alcohol abuse who is being seen today for the evaluation of cardiomyopathy at the request of Dr. Juliene PinaMody.  History of Present Illness:   Allen Bass has no previously known cardiac history. He reports a history of prior "heavy alcohol abuse" and ongoing cocaine, heroin, marijuana, and tobacco abuse since age 89. He had recently been clean for ~ 22 months per his report and suffered a relapse this week on 12/28. He used some heroin per his report. His roommate found him unconscious on the soda. EMS was called and administered Narcan with increase in respirations noted. [facility] he was agitated, though not following commands. He was intubated for airway protection. CXR showed multifocal PNA, felt to be aspiration. Urine drug screen was positive for cocaine. Troponin peaked at 0.38. D-dimer was elevated at > 10,000. Blood culture was positive x 1 for methicillin-resistant coag negative staph. Echo showed an EF of 30-35%, unable to exclude RWMA, mildly dilated LA and RV with mildly reduced RV systolic function. He required Levophed and neosynephrine, which have since been weaned off. He has been treated with ABX per IM. He received IV Lasix x 1 on 12/30. He remains on IV fluids. He was extubated on 12/30 and is now on room air. Never with chest pain.   Past Medical History:  Diagnosis Date  . Polysubstance abuse (HCC)    a. ongoing cocaine, heroin, marijuana, and tobacco abuse     Home Meds: Prior to Admission medications   Not on File    Inpatient  Medications: Scheduled Meds: . amoxicillin-clavulanate  1 tablet Oral BID  . cloNIDine  0.1 mg Oral Q6H  . famotidine  20 mg Oral BID  . heparin  5,000 Units Subcutaneous Q8H  . insulin aspart  0-9 Units Subcutaneous Q4H  . lisinopril  5 mg Oral Daily  . LORazepam  0-4 mg Intravenous Q6H   Followed by  . [START ON 07/25/2017] LORazepam  0-4 mg Intravenous Q12H  . metoprolol tartrate  25 mg Oral BID  . pantoprazole (PROTONIX) IV  40 mg Intravenous Daily  . vecuronium  10 mg Intravenous Once   Continuous Infusions: . sodium chloride    . sodium chloride 75 mL/hr at 07/23/17 2000  . azithromycin Stopped (07/23/17 1852)  . sodium chloride Stopped (07/22/17 0100)   Followed by  . sodium chloride     PRN Meds: sodium chloride, acetaminophen, bisacodyl, ipratropium-albuterol, levalbuterol, LORazepam **OR** LORazepam, ondansetron (ZOFRAN) IV, sennosides  Allergies:  No Known Allergies  Social History:   Social History   Socioeconomic History  . Marital status: Unknown    Spouse name: Not on file  . Number of children: Not on file  . Years of education: Not on file  . Highest education level: Not on file  Social Needs  . Financial resource strain: Not on file  . Food insecurity - worry: Not on file  . Food insecurity - inability: Not on file  . Transportation needs - medical: Not on file  . Transportation needs - non-medical: Not on file  Occupational History  .  Not on file  Tobacco Use  . Smoking status: Current Every Day Smoker    Packs/day: 1.00    Years: 25.00    Pack years: 25.00  Substance and Sexual Activity  . Alcohol use: Yes    Frequency: Never    Comment: prior "heavy drinker"    . Drug use: Yes    Types: Cocaine, Heroin  . Sexual activity: Not on file  Other Topics Concern  . Not on file  Social History Narrative  . Not on file     Family History:  Family History  Problem Relation Age of Onset  . CAD Father        a. MI age 29    ROS:  Review of  Systems  Constitutional: Positive for malaise/fatigue. Negative for chills, diaphoresis, fever and weight loss.  HENT: Negative for congestion.   Eyes: Negative for discharge and redness.  Respiratory: Negative for cough, hemoptysis, sputum production, shortness of breath and wheezing.   Cardiovascular: Negative for chest pain, palpitations, orthopnea, claudication, leg swelling and PND.  Gastrointestinal: Negative for abdominal pain, blood in stool, heartburn, melena, nausea and vomiting.  Genitourinary: Negative for hematuria.  Musculoskeletal: Negative for falls and myalgias.  Skin: Negative for rash.  Neurological: Negative for dizziness, tingling, tremors, sensory change, speech change, focal weakness, loss of consciousness and weakness.  Endo/Heme/Allergies: Does not bruise/bleed easily.  Psychiatric/Behavioral: Positive for substance abuse. The patient is not nervous/anxious.   All other systems reviewed and are negative.     Physical Exam/Data:   Vitals:   07/23/17 2200 07/23/17 2338 07/24/17 0406 07/24/17 1230  BP: (!) 151/88 (!) 151/91 (!) 150/95   Pulse: (!) 107 (!) 102 88 85  Resp: (!) 21 20 20    Temp:  98.5 F (36.9 C) 98.1 F (36.7 C)   TempSrc:  Oral Oral   SpO2: 96% 94% 98% 98%  Weight:  214 lb 9.6 oz (97.3 kg) 215 lb (97.5 kg)   Height:        Intake/Output Summary (Last 24 hours) at 07/24/2017 1246 Last data filed at 07/24/2017 1021 Gross per 24 hour  Intake 1683.75 ml  Output 5625 ml  Net -3941.25 ml   Filed Weights   07/23/17 0500 07/23/17 2338 07/24/17 0406  Weight: 240 lb 8.4 oz (109.1 kg) 214 lb 9.6 oz (97.3 kg) 215 lb (97.5 kg)   Body mass index is 29.16 kg/m.   Physical Exam: General: Well developed, well nourished, in no acute distress. Head: Normocephalic, atraumatic, sclera non-icteric, no xanthomas, nares without discharge.  Neck: Negative for carotid bruits. JVD not elevated. Lungs: Clear bilaterally to auscultation without wheezes,  rales, or rhonchi. Breathing is unlabored. Heart: RRR with S1 S2. No murmurs, rubs, or gallops appreciated. Abdomen: Soft, non-tender, non-distended with normoactive bowel sounds. No hepatomegaly. No rebound/guarding. No obvious abdominal masses. Msk:  Strength and tone appear normal for age. Extremities: No clubbing or cyanosis. No edema. Distal pedal pulses are 2+ and equal bilaterally. Track marks noted.  Neuro: Alert and oriented X 3. No facial asymmetry. No focal deficit. Moves all extremities spontaneously. Psych:  Responds to questions appropriately with a normal affect.   EKG:  The EKG was personally reviewed and demonstrates: sinus tachycardia, 136 bpm, nonspecific IVCD, nonspecific st/t changes Telemetry:  Telemetry was personally reviewed and demonstrates: NSR  Weights: Filed Weights   07/23/17 0500 07/23/17 2338 07/24/17 0406  Weight: 240 lb 8.4 oz (109.1 kg) 214 lb 9.6 oz (97.3 kg) 215 lb (97.5  kg)    Relevant CV Studies: TTE 07/23/2017: Study Conclusions  - Left ventricle: The cavity size was normal. Systolic function was   moderately to severely reduced. The estimated ejection fraction   was in the range of 30% to 35%. Diffuse hypokinesis. Regional   wall motion abnormalities cannot be excluded. - Left atrium: The atrium was mildly dilated. - Right ventricle: The cavity size was mildly dilated. Wall   thickness was normal. Systolic function was mildly reduced. - Pulmonary arteries: Systolic pressure could not be accurately   estimated.  Laboratory Data:  Chemistry Recent Labs  Lab 07/21/17 1915 07/22/17 0426 07/23/17 0354  NA 134* 136 138  K 4.7 4.7 4.1  CL 98* 104 108  CO2 22 27 23   GLUCOSE 260* 101* 125*  BUN 16 16 14   CREATININE 1.81* 1.15 0.78  CALCIUM 8.9 7.7* 8.1*  GFRNONAA 21* >60 >60  GFRAA 25* >60 >60  ANIONGAP 14 5 7     Recent Labs  Lab 07/22/17 0426  PROT 5.8*  ALBUMIN 3.2*  AST 130*  ALT 72*  ALKPHOS 48  BILITOT 0.8    Hematology Recent Labs  Lab 07/21/17 1915 07/22/17 0426 07/23/17 0354  WBC 17.9* 11.5* 7.0  RBC 5.69 4.87 4.46  HGB 16.6 14.2 13.0  HCT 50.5 42.9 38.3*  MCV 88.8 88.0 86.0  MCH 29.2 29.1 29.2  MCHC 32.9 33.1 33.9  RDW 14.6* 14.2 14.7*  PLT 279 158 144*   Cardiac Enzymes Recent Labs  Lab 07/21/17 1915 07/22/17 0101 07/22/17 0426 07/22/17 1041  TROPONINI 0.05* 0.37* 0.38* 0.33*   No results for input(s): TROPIPOC in the last 168 hours.  BNPNo results for input(s): BNP, PROBNP in the last 168 hours.  DDimer No results for input(s): DDIMER in the last 168 hours.  Radiology/Studies:  Dg Chest Port 1 View  Result Date: 07/23/2017 IMPRESSION: Improved aeration on the left with persistent left basilar changes. Electronically Signed   By: Alcide Clever M.D.   On: 07/23/2017 07:18   Portable Chest Xray  Result Date: 07/22/2017 IMPRESSION: Persistent pulmonary edema, worsened compared prior exam. Interval developed consolidation of left lung base suspicious for developing pneumonia with superimposed pleural effusion. Electronically Signed   By: Sherian Rein M.D.   On: 07/22/2017 07:05   Dg Chest Port 1 View  Result Date: 07/21/2017 IMPRESSION: Patchy consolidation of bilateral which may be due to asymmetric edema, ARDS or pneumonia. This is not significantly changed compared to prior exam. Endotracheal tube in good position.  There is no pneumothorax. Electronically Signed   By: Sherian Rein M.D.   On: 07/21/2017 21:14   Dg Chest Port 1 View  Result Date: 07/21/2017 IMPRESSION: Findings may represent asymmetric edema, ARDS or pneumonia. Clinical correlation is recommended. Electronically Signed   By: Elgie Collard M.D.   On: 07/21/2017 19:49   Dg Abd Portable 1 View  Result Date: 07/21/2017 IMPRESSION: Nasogastric tube is identified with distal tip in the mid stomach. Electronically Signed   By: Sherian Rein M.D.   On: 07/21/2017 21:16    Assessment and Plan:    1. Acute systolic CHF: -Most likely secondary to his polysubstance abuse -He does not appear grossly volume overloaded at this time -He denies using cocaine -Recommend Toprol XL 12.5 mg daily in place of Lopressor given his cardiomyopathy -Change lisinopril to losartan with plans to transition to [facility] as an outpatient (after 36 hour washout) -Add spironolactone -CHF education -Daily weights, strict Is and Os -He  will need ischemic workup, timing to be determined -Stop IV fluids  2. Elevated troponin: -Never with chest pain -Troponin peaked at 0.38, down trended -Echo with reduced EF as above -Likely supply demand ischemia in the setting of his acute illness and respiratory failure -Will need ischemic workup as above  3. Polysubstance abuse: -Reports, "I've used everything," dating back to age 41 -Reports was previously clean for 22 months, relapsed this week as above -Recommend case manager/social work involvement to help with his transition upon discharge -Cessation is advised  -Blood culture with methicillin-resistant coagulase negative staph, felt to be contaminant  -Consider repeat cultures given his IV drug use history, defer to IM  4. Acute respiratory failure with hypoxia: -Intubated from 12/28-->12/30 -Extubated 12/30, now on room air -In the setting of drug overdose and aspiration PNA -As above  5. Aspiration PNA: -Per IM  6. Elevated D-dimer: -Likely in the setting of the above -Defer PE workup to IM  7. AKI: -Likely 2/2 the above -Improved    For questions or updates, please contact [facility] Please consult [email or web address] for contact info under Cardiology/STEMI.   Signed, Eula Listenyan Sawyer Mentzer, PA-C [facility] Pager: [phone] 07/24/2017, 12:46 PM

## 2017-07-24 NOTE — Progress Notes (Signed)
CONCERNING: IV to Oral Route Change Policy  RECOMMENDATION: This patient is receiving famotidine by the intravenous route.  Based on criteria approved by the Pharmacy and Therapeutics Committee, the intravenous medication(s) is/are being converted to the equivalent oral dose form(s).   DESCRIPTION: These criteria include:  The patient is eating (either orally or via tube) and/or has been taking other orally administered medications for a least 24 hours  The patient has no evidence of active gastrointestinal bleeding or impaired GI absorption (gastrectomy, short bowel, patient on TNA or NPO).  If you have questions about this conversion, please contact the Pharmacy Department  []   [phone]( [phone] )  Allen Bass [x]   [phone]( [phone] )  [facility] []   [phone]( [phone] )  [facility] []   [phone]( [phone] )  [facility] []   [phone]( [phone] )  [facility]   Simpson,Michael L, [facility] HealthRPH 07/24/2017 12:03 PM

## 2017-07-24 NOTE — Progress Notes (Signed)
[employer] - [place] at [facility]   PATIENT NAME: Allen Bass    MR#:  [id number]  DATE OF BIRTH:  1976/04/13  SUBJECTIVE:   Patient extubated Family at bedside Still a bit confused at times  REVIEW OF SYSTEMS:    Review of Systems  Constitutional: Negative for fever, chills weight loss HENT: Negative for ear pain, nosebleeds, congestion, facial swelling, rhinorrhea, neck pain, neck stiffness and ear discharge.   Respiratory: Negative for cough, shortness of breath, wheezing  Cardiovascular: Negative for chest pain, palpitations and leg swelling.  Gastrointestinal: Negative for heartburn, abdominal pain, vomiting, diarrhea or consitpation Genitourinary: Negative for dysuria, urgency, frequency, hematuria Musculoskeletal: Negative for back pain or joint pain Neurological: Negative for dizziness, seizures, syncope, focal weakness,  numbness and headaches.  Hematological: Does not bruise/bleed easily.  Psychiatric/Behavioral: Negative for hallucinations, confusion, dysphoric mood    Tolerating Diet: yes      DRUG ALLERGIES:  No Known Allergies  VITALS:  Blood pressure (!) 150/95, pulse 88, temperature 98.1 F (36.7 C), temperature source Oral, resp. rate 20, height 6' (1.829 m), weight 97.5 kg (215 lb), SpO2 98 %.  PHYSICAL EXAMINATION:  Constitutional: Appears well-developed and well-nourished. No distress. HENT: Normocephalic. Marland Kitchen. Oropharynx is clear and moist.  Eyes: Conjunctivae and EOM are normal. PERRLA, no scleral icterus.  Neck: Normal ROM. Neck supple. No JVD. No tracheal deviation. CVS: RRR, S1/S2 +, no murmurs, no gallops, no carotid bruit.  Pulmonary: Effort and breath sounds normal, no stridor, rhonchi, wheezes, rales.  Abdominal: Soft. BS +,  no distension, tenderness, rebound or guarding.  Musculoskeletal: Normal range of motion. No edema and no tenderness.  Neuro: Alert. CN 2-12 grossly intact. No focal deficits. Skin: Skin is warm  and dry. No rash noted. Psychiatric: Normal mood and affect.      LABORATORY PANEL:   CBC Recent Labs  Lab 07/23/17 0354  WBC 7.0  HGB 13.0  HCT 38.3*  PLT 144*   ------------------------------------------------------------------------------------------------------------------  Chemistries  Recent Labs  Lab 07/22/17 0426 07/23/17 0354  NA 136 138  K 4.7 4.1  CL 104 108  CO2 27 23  GLUCOSE 101* 125*  BUN 16 14  CREATININE 1.15 0.78  CALCIUM 7.7* 8.1*  MG  --  1.7  AST 130*  --   ALT 72*  --   ALKPHOS 48  --   BILITOT 0.8  --    ------------------------------------------------------------------------------------------------------------------  Cardiac Enzymes Recent Labs  Lab 07/22/17 0101 07/22/17 0426 07/22/17 1041  TROPONINI 0.37* 0.38* 0.33*   ------------------------------------------------------------------------------------------------------------------  RADIOLOGY:  Dg Chest Port 1 View  Result Date: 07/23/2017 CLINICAL DATA:  Acute respiratory failure EXAM: PORTABLE CHEST 1 VIEW COMPARISON:  07/22/2017 FINDINGS: Cardiac shadow is enlarged. Endotracheal tube and nasogastric catheter are stable. There is been significant improved aeration in the left lung although persistent infiltrative changes are noted in the left base. No pneumothorax is seen. No bony abnormality is noted. IMPRESSION: Improved aeration on the left with persistent left basilar changes. Electronically Signed   By: Alcide CleverMark  Lukens M.D.   On: 07/23/2017 07:18     ASSESSMENT AND PLAN:   41 year old male with cocaine abuse who presents to the emergency room after being found unresponsive.  1. Acute hypoxic/hypercarbic respiratory failure in the setting of multifocal pneumonia due to aspiration  Change Zosyn to Augmentin Patient extubated on December 30 and doing well  2. Septic shock due to pneumonia Patient off of pressors Septic shock resolved  3. Elevated troponin due  to  demand ischemia Not ACS  4. New onset systolic heart failure ejection fraction 30-35% with diffuse hypokinesis Start metoprolol 25 by mouth twice a day and lisinopril 10 mg daily Case [facility] with [facility] cardiology   5 Acute kidney injury in the setting of septic shock/ATN Resolved with IV fluids  6. Cocaine abuse: Child psychotherapistocial worker consultation placed       Management plans discussed with the patient and family and they are in agreement.  CODE STATUS: full  TOTAL TIME TAKING CARE OF THIS PATIENT: 30 minutes.     POSSIBLE D/C tomorrow, DEPENDING ON CLINICAL CONDITION.   Mariska Daffin M.D on 07/24/2017 at 11:56 AM  Between 7am to 6pm - Pager - [phone] After 6pm go to [email or web address] - Social research officer, governmentpassword EPAS [facility]  [facility] [place] [facility]  Office  [phone]  CC: Primary care physician; No primary care provider on file.  Note: This dictation was prepared with Dragon dictation along with smaller phrase technology. Any transcriptional errors that result from this process are unintentional.

## 2017-07-25 DIAGNOSIS — J9602 Acute respiratory failure with hypercapnia: Secondary | ICD-10-CM

## 2017-07-25 DIAGNOSIS — I509 Heart failure, unspecified: Secondary | ICD-10-CM

## 2017-07-25 DIAGNOSIS — I5021 Acute systolic (congestive) heart failure: Secondary | ICD-10-CM

## 2017-07-25 LAB — GLUCOSE, CAPILLARY
GLUCOSE-CAPILLARY: 87 mg/dL (ref 65–99)
Glucose-Capillary: 101 mg/dL — ABNORMAL HIGH (ref 65–99)
Glucose-Capillary: 110 mg/dL — ABNORMAL HIGH (ref 65–99)
Glucose-Capillary: 84 mg/dL (ref 65–99)

## 2017-07-25 LAB — BASIC METABOLIC PANEL
Anion gap: 7 (ref 5–15)
BUN: 16 mg/dL (ref 6–20)
CHLORIDE: 105 mmol/L (ref 101–111)
CO2: 29 mmol/L (ref 22–32)
Calcium: 8.9 mg/dL (ref 8.9–10.3)
Creatinine, Ser: 0.93 mg/dL (ref 0.61–1.24)
GFR calc non Af Amer: >60 mL/min (ref >60)
Glucose, Bld: 104 mg/dL — ABNORMAL HIGH (ref 65–99)
POTASSIUM: 3.6 mmol/L (ref 3.5–5.1)
SODIUM: 141 mmol/L (ref 135–145)

## 2017-07-25 MED ORDER — LOSARTAN POTASSIUM 100 MG PO TABS: 100.0000 mg | ORAL_TABLET | Freq: Every day | ORAL | 0 refills | Status: DC

## 2017-07-25 MED ORDER — LOSARTAN POTASSIUM 25 MG PO TABS
25.0000 mg | ORAL_TABLET | Freq: Every day | ORAL | Status: DC
  Administered 2017-07-25: 25 mg via ORAL
  Filled 2017-07-25: qty 1

## 2017-07-25 MED ORDER — LOSARTAN POTASSIUM 50 MG PO TABS: 100.0000 mg | ORAL_TABLET | Freq: Every day | ORAL | Status: DC

## 2017-07-25 MED ORDER — SPIRONOLACTONE 25 MG PO TABS: 25.0000 mg | ORAL_TABLET | Freq: Every day | ORAL | Status: DC

## 2017-07-25 MED ORDER — METOPROLOL SUCCINATE ER 25 MG PO TB24: 12.5000 mg | ORAL_TABLET | Freq: Every day | ORAL | 0 refills | Status: DC

## 2017-07-25 MED ORDER — LOSARTAN POTASSIUM 25 MG PO TABS: 25.0000 mg | ORAL_TABLET | Freq: Every day | ORAL | 0 refills | Status: DC

## 2017-07-25 MED ORDER — NICOTINE 21 MG/24HR TD PT24: 21.0000 mg | MEDICATED_PATCH | Freq: Every day | TRANSDERMAL | 0 refills | Status: DC

## 2017-07-25 MED ORDER — FUROSEMIDE 40 MG PO TABS: 40.0000 mg | ORAL_TABLET | Freq: Two times a day (BID) | ORAL | 0 refills | Status: DC

## 2017-07-25 MED ORDER — SPIRONOLACTONE 25 MG PO TABS: 12.5000 mg | ORAL_TABLET | Freq: Every day | ORAL | 0 refills | Status: DC

## 2017-07-25 MED ORDER — SPIRONOLACTONE 25 MG PO TABS: 25.0000 mg | ORAL_TABLET | Freq: Every day | ORAL | 0 refills | Status: DC

## 2017-07-25 MED ORDER — AMOXICILLIN-POT CLAVULANATE 875-125 MG PO TABS: 1.0000 | ORAL_TABLET | Freq: Two times a day (BID) | ORAL | 0 refills | Status: AC

## 2017-07-25 NOTE — Discharge Summary (Addendum)
[employer] - [place] at [facility]   PATIENT NAME: Allen Bass    MR#:  [id number]  DATE OF BIRTH:  09/16/1975  DATE OF ADMISSION:  07/21/2017 ADMITTING PHYSICIAN: Oralia Manis, MD  DATE OF DISCHARGE: 07/25/2017  PRIMARY CARE PHYSICIAN: No primary care provider on file.    ADMISSION DIAGNOSIS:  Acute respiratory failure with hypoxia and hypercapnia (HCC) [J96.01, J96.02]  DISCHARGE DIAGNOSIS:  Principal Problem:   Acute respiratory failure with hypoxia and hypercapnia (HCC) Active Problems:   Sepsis (HCC)   Drug overdose   Aspiration pneumonia (HCC)   AKI (acute kidney injury) (HCC)   Acute respiratory failure (HCC)   Acute CHF (congestive heart failure) (HCC)   SECONDARY DIAGNOSIS:   Past Medical History:  Diagnosis Date  . Polysubstance abuse (HCC)    a. ongoing cocaine, heroin, marijuana, and tobacco abuse    HOSPITAL COURSE:   42 year old male with cocaine abuse who presents to the emergency room after being found unresponsive.  1. Acute hypoxic/hypercarbic respiratory failure in the setting of multifocal pneumonia due to aspiration  Patient was changed from Zosyn to Augmentin. He will continue Augmentin for a few more days complete treatment for aspiration pneumonia. Patient extubated on December 30 and doing well  2. Septic shock due to pneumonia Patient was initially on pressors and has been off of pressors for several days.  Septic shock has resolved  3. Elevated troponin due to demand ischemia She was ruled out for acute coronary syndrome.  4. New onset systolic heart failure ejection fraction 30-35% with diffuse hypokinesis She was evaluated by cardiology. Patient will further outpatient workup. Patient was started on beta blocker, ACE inhibitor and ARB. He is encouraged to stop drinking alcoholic and using illicit drugs as this is contributing to new onset cardiomyopathy.   5 Acute kidney injury in the setting of septic  shock/ATN This has resolved with IV fluids.  6. Cocaine abuse: Encouraged to remain abstinent  7. Tobacco dependence: Patient is encouraged to quit smoking. Counseling was provided for 4 minutes.    DISCHARGE CONDITIONS AND DIET:   Stable for discharge on heart healthy diet  CONSULTS OBTAINED:  Treatment Team:  Antonieta Iba, MD  DRUG ALLERGIES:  No Known Allergies  DISCHARGE MEDICATIONS:   Allergies as of 07/25/2017   No Known Allergies     Medication List    TAKE these medications   amoxicillin-clavulanate 875-125 MG tablet Commonly known as:  AUGMENTIN Take 1 tablet by mouth 2 (two) times daily for 3 days.   furosemide 40 MG tablet Commonly known as:  LASIX Take 1 tablet (40 mg total) by mouth 2 (two) times daily.   losartan 25 MG tablet Commonly known as:  COZAAR Take 1 tablet (25 mg total) by mouth daily. Start taking on:  07/26/2017   metoprolol succinate 25 MG 24 hr tablet Commonly known as:  TOPROL-XL Take 0.5 tablets (12.5 mg total) by mouth daily. Start taking on:  07/26/2017   nicotine 21 mg/24hr patch Commonly known as:  NICODERM CQ - dosed in mg/24 hours Place 1 patch (21 mg total) onto the skin daily.   spironolactone 25 MG tablet Commonly known as:  ALDACTONE Take 0.5 tablets (12.5 mg total) by mouth daily. Start taking on:  07/26/2017         Today   CHIEF COMPLAINT:  No acute events overnight   VITAL SIGNS:  Blood pressure (!) 149/91, pulse 63, temperature 98.1 F (36.7 C), temperature source Oral,  resp. rate 16, height 6' (1.829 m), weight 91.8 kg (202 lb 6.4 oz), SpO2 98 %.   REVIEW OF SYSTEMS:  Review of Systems  Constitutional: Negative.  Negative for chills, fever and malaise/fatigue.  HENT: Negative.  Negative for ear discharge, ear pain, hearing loss, nosebleeds and sore throat.   Eyes: Negative.  Negative for blurred vision and pain.  Respiratory: Negative.  Negative for cough, hemoptysis, shortness of breath and  wheezing.   Cardiovascular: Negative.  Negative for chest pain, palpitations and leg swelling.  Gastrointestinal: Negative.  Negative for abdominal pain, blood in stool, diarrhea, nausea and vomiting.  Genitourinary: Negative.  Negative for dysuria.  Musculoskeletal: Negative.  Negative for back pain.  Skin: Negative.   Neurological: Negative for dizziness, tremors, speech change, focal weakness, seizures and headaches.  Endo/Heme/Allergies: Negative.  Does not bruise/bleed easily.  Psychiatric/Behavioral: Negative.  Negative for depression, hallucinations and suicidal ideas.     PHYSICAL EXAMINATION:  GENERAL:  42 y.o.-year-old patient lying in the bed with no acute distress.  NECK:  Supple, no jugular venous distention. No thyroid enlargement, no tenderness.  LUNGS: Normal breath sounds bilaterally, no wheezing, rales,rhonchi  No use of accessory muscles of respiration.  CARDIOVASCULAR: S1, S2 normal. No murmurs, rubs, or gallops.  ABDOMEN: Soft, non-tender, non-distended. Bowel sounds present. No organomegaly or mass.  EXTREMITIES: No pedal edema, cyanosis, or clubbing.  PSYCHIATRIC: The patient is alert and oriented x 3.  SKIN: No obvious rash, lesion, or ulcer.   DATA REVIEW:   CBC Recent Labs  Lab 07/23/17 0354  WBC 7.0  HGB 13.0  HCT 38.3*  PLT 144*    Chemistries  Recent Labs  Lab 07/22/17 0426 07/23/17 0354 07/25/17 0551  NA 136 138 141  K 4.7 4.1 3.6  CL 104 108 105  CO2 27 23 29   GLUCOSE 101* 125* 104*  BUN 16 14 16   CREATININE 1.15 0.78 0.93  CALCIUM 7.7* 8.1* 8.9  MG  --  1.7  --   AST 130*  --   --   ALT 72*  --   --   ALKPHOS 48  --   --   BILITOT 0.8  --   --     Cardiac Enzymes Recent Labs  Lab 07/22/17 0101 07/22/17 0426 07/22/17 1041  TROPONINI 0.37* 0.38* 0.33*    Microbiology Results  @MICRORSLT48 @  RADIOLOGY:  No results found.    Allergies as of 07/25/2017   No Known Allergies     Medication List    TAKE these  medications   amoxicillin-clavulanate 875-125 MG tablet Commonly known as:  AUGMENTIN Take 1 tablet by mouth 2 (two) times daily for 3 days.   furosemide 40 MG tablet Commonly known as:  LASIX Take 1 tablet (40 mg total) by mouth 2 (two) times daily.   losartan 25 MG tablet Commonly known as:  COZAAR Take 1 tablet (25 mg total) by mouth daily. Start taking on:  07/26/2017   metoprolol succinate 25 MG 24 hr tablet Commonly known as:  TOPROL-XL Take 0.5 tablets (12.5 mg total) by mouth daily. Start taking on:  07/26/2017   nicotine 21 mg/24hr patch Commonly known as:  NICODERM CQ - dosed in mg/24 hours Place 1 patch (21 mg total) onto the skin daily.   spironolactone 25 MG tablet Commonly known as:  ALDACTONE Take 0.5 tablets (12.5 mg total) by mouth daily. Start taking on:  07/26/2017          Management plans discussed  with the patient and he is in agreement. Stable for discharge   Patient should follow up with cardiology  CODE STATUS:     Code Status Orders  (From admission, onward)        Start     Ordered   07/21/17 2250  Full code  Continuous     07/21/17 2249    Code Status History    Date Active Date Inactive Code Status Order ID Comments User Context   This patient has a current code status but no historical code status.      TOTAL TIME TAKING CARE OF THIS PATIENT: 38 minutes.    Note: This dictation was prepared with Dragon dictation along with smaller phrase technology. Any transcriptional errors that result from this process are unintentional.  Taylin Mans M.D on 07/25/2017 at 10:44 AM  Between 7am to 6pm - Pager - [phone] After 6pm go to [email or web address] - Social research officer, government  [facility] [place] [facility]  Office  [phone]  CC: Primary care physician; No primary care provider on file.

## 2017-07-25 NOTE — Care Management (Addendum)
Spoke with patient and his parents about home health physical therapy.  Patient does not have a primary care physician to follow for home health.  Patient's parents are going to take him to [facility] in [place] for a few days and does not feel patient needs physical therapy. Notified attending.   Mother is requesting resources for addiction- outpatient and inpatient.  Informed CSW.

## 2017-07-25 NOTE — Progress Notes (Signed)
Progress Note  Patient Name: Allen Bass Date of Encounter: 07/25/2017  Primary Cardiologist: New to [facility]  Subjective   Feels well, no complaints Reports he is able to ambulate around the floor without assistance Denies significant shortness of breath or chest discomfort Parents at the bedside, he is going to [place] for recovery, [facility]     Inpatient Medications    Scheduled Meds: . amoxicillin-clavulanate  1 tablet Oral BID  . famotidine  20 mg Oral BID  . heparin  5,000 Units Subcutaneous Q8H  . insulin aspart  0-9 Units Subcutaneous Q4H  . LORazepam  0-4 mg Intravenous Q6H   Followed by  . LORazepam  0-4 mg Intravenous Q12H  . [START ON 07/26/2017] losartan  100 mg Oral Daily  . metoprolol succinate  12.5 mg Oral Daily  . pantoprazole (PROTONIX) IV  40 mg Intravenous Daily  . [START ON 07/26/2017] spironolactone  25 mg Oral Daily  . vecuronium  10 mg Intravenous Once   Continuous Infusions: . sodium chloride    . sodium chloride Stopped (07/22/17 0100)   Followed by  . sodium chloride     PRN Meds: sodium chloride, acetaminophen, bisacodyl, ipratropium-albuterol, levalbuterol, LORazepam **OR** LORazepam, ondansetron (ZOFRAN) IV, sennosides   Vital Signs    Vitals:   07/24/17 2358 07/25/17 0434 07/25/17 0437 07/25/17 1009  BP: 125/79  (!) 154/94 (!) 149/91  Pulse: 70  (!) 56 63  Resp: 16  16   Temp: 98.1 F (36.7 C)  98.1 F (36.7 C)   TempSrc: Oral  Oral   SpO2: 96%  98%   Weight:  202 lb 6.4 oz (91.8 kg)    Height:        Intake/Output Summary (Last 24 hours) at 07/25/2017 1242 Last data filed at 07/25/2017 0900 Gross per 24 hour  Intake 600 ml  Output -  Net 600 ml   Filed Weights   07/23/17 2338 07/24/17 0406 07/25/17 0434  Weight: 214 lb 9.6 oz (97.3 kg) 215 lb (97.5 kg) 202 lb 6.4 oz (91.8 kg)    Telemetry    Normal sinus rhythm- Personally Reviewed  ECG      Physical Exam   GEN: No acute distress.   Neck: No  JVD Cardiac: RRR, no murmurs, rubs, or gallops.  Respiratory: Clear to auscultation bilaterally. GI: Soft, nontender, non-distended  MS: No edema; No deformity. Neuro:  Nonfocal  Psych: Normal affect   Labs    Chemistry Recent Labs  Lab 07/22/17 0426 07/23/17 0354 07/25/17 0551  NA 136 138 141  K 4.7 4.1 3.6  CL 104 108 105  CO2 27 23 29   GLUCOSE 101* 125* 104*  BUN 16 14 16   CREATININE 1.15 0.78 0.93  CALCIUM 7.7* 8.1* 8.9  PROT 5.8*  --   --   ALBUMIN 3.2*  --   --   AST 130*  --   --   ALT 72*  --   --   ALKPHOS 48  --   --   BILITOT 0.8  --   --   GFRNONAA >60 >60 >60  GFRAA >60 >60 >60  ANIONGAP 5 7 7      Hematology Recent Labs  Lab 07/21/17 1915 07/22/17 0426 07/23/17 0354  WBC 17.9* 11.5* 7.0  RBC 5.69 4.87 4.46  HGB 16.6 14.2 13.0  HCT 50.5 42.9 38.3*  MCV 88.8 88.0 86.0  MCH 29.2 29.1 29.2  MCHC 32.9 33.1 33.9  RDW 14.6* 14.2  14.7*  PLT 279 158 144*    Cardiac Enzymes Recent Labs  Lab 07/21/17 1915 07/22/17 0101 07/22/17 0426 07/22/17 1041  TROPONINI 0.05* 0.37* 0.38* 0.33*   No results for input(s): TROPIPOC in the last 168 hours.   BNPNo results for input(s): BNP, PROBNP in the last 168 hours.   DDimer No results for input(s): DDIMER in the last 168 hours.   Radiology    No results found.  Cardiac Studies   Echocardiogram with ejection fraction 30-35%  Patient Profile     42 year old male with history of polysubstance abuse including cocaine, heroin, marijuana, tobacco abuse, alcohol who presents to the hospital after being found unconscious, intubated for airway protection, cocaine positive tox screen, aspiration pneumonia echocardiogram With ejection fraction 30-35%  Assessment & Plan    Dilated cardiomyopathy Long history of alcohol, drug abuse including cocaine (tox screen positive) Unable to exclude stress cardiomyopathy from intubation and pneumonia, hypoxia.  Less likely ischemic Strongly recommended cessation of  alcohol and drugs of abuse.  Discussed with him again today Continue losartan 100 mg daily, Aldactone 25 mg daily, low-dose metoprolol succinate 12.5 mg daily At a later date would consider changing losartan to Entresto  Elevated troponin Demand ischemia in the setting of acute respiratory distress, respiratory failure requiring intubation Follow-up as an outpatient  Will perform outpatient echocardiogram in several months for the estimation of ejection fraction  polysubstance abuse Dating back to age 42 Relapse this week per the notes Cocaine positive, long history of alcohol abuse  Long discussion with family concerning above, recovery, follow-up, medications  Total encounter time more than 35 minutes  Greater than 50% was spent in counseling and coordination of care with the patient   For questions or updates, please contact [facility] Please consult [email or web address] for contact info under Cardiology/STEMI.      Signed, Julien Nordmannimothy Bria Sparr, MD  07/25/2017, 12:42 PM

## 2017-07-25 NOTE — Clinical Social Work Note (Signed)
CSW received referral that patient and family wanted resources for substance abuse treatment.  CSW provided requested information for patient and his family.  CSW to sign off, please reconsult if other social work needs arise.  Allen KnackEric R. Jazmen Bass, MSW, Theresia MajorsLCSWA [phone]  07/25/2017 1:26 PM

## 2017-07-25 NOTE — Progress Notes (Signed)
Discharge instructions along with home medications and follow up gone over with patient and mother. Both verbalize that they understood instructions. 5 prescriptions given to patient. IV's and tele removed. Pt being discharged home on room air, no distress noted. Otilio JeffersonMadelyn S Fenton, RN

## 2017-07-26 LAB — CULTURE, BLOOD (ROUTINE X 2): Culture: NO GROWTH

## 2017-07-27 ENCOUNTER — Telehealth: Payer: Self-pay | Admitting: Cardiovascular Disease

## 2017-07-27 NOTE — Telephone Encounter (Signed)
[facility] on 07/[facility]/17.   Patient understands to follow up with provider ? [facility]  Patient understands discharge instructions? Yes   Patient understands medications and regiment? Yes Patient understands to bring all medications to this visit? Yes

## 2017-07-27 NOTE — Telephone Encounter (Signed)
TCM....  Patient is being discharged   They saw Dr. Mariah MillingGollan & Eula Listenyan Dunn  They are scheduled to see Dr Mariah MillingGollan on 01/08  They were seen for Dilated cardiomyopathy  They need to be seen within 1 week  Pt not added to wait list   Please call

## 2017-07-28 ENCOUNTER — Telehealth: Payer: Self-pay

## 2017-07-28 ENCOUNTER — Telehealth: Payer: Self-pay | Admitting: Cardiovascular Disease

## 2017-07-28 NOTE — Telephone Encounter (Signed)
-----   Message from Delma Freezeina A Hackney, [place] sent at 07/26/2017  8:29 AM EST ----- Regarding: Please call to schedule appointment Hospitalist placed referral in workque. Discharged 1/1

## 2017-07-28 NOTE — Telephone Encounter (Signed)
Spoke with patient and he reviewed blood pressure readings listed below. He reviewed new medications and was concerned. He took blood pressure and currently it is 101/71. Instructed him to monitor blood pressures, keep a log of those readings, and to bring those in with him to his appointment on Tuesday. He verbalized understanding of our conversation, agreement with plan, and had no further questions at this time. Confirmed upcoming appointment and no other concerns.

## 2017-07-28 NOTE — Telephone Encounter (Signed)
°  Pt c/o BP issue: STAT if pt c/o blurred vision, one-sided weakness or slurred speech  1. What are your last 5 BP readings?  Tues 152/101 Wed morning 137/94 Wed night 128/91 Thurs morning 117/91 Thurs night 110/79 Fri morning 102/74 Currently 89/63   2. Are you having any other symptoms (ex. Dizziness, headache, blurred vision, passed out)? Not really, just like hot flashes  3. What is your BP issue? BP decreasing rapidly  Patient was discharged from hospital on the 1st He just started taking :  metoprolol losartan furosemide spironolactone

## 2017-07-28 NOTE — Telephone Encounter (Signed)
Attempted to contact patient to schedule appointment per referral received. No answer and voicemail box has not been set up so unable to LM

## 2017-07-30 NOTE — Progress Notes (Signed)
Cardiology Office Note  Date:  08/01/2017   ID:  Allen Bass, DOB 1976-06-23, MRN [id number]  PCP:  Patient, No Pcp Per   Chief Complaint  Patient presents with  . Other    1 week follow up. Patient denies chest pain and SOB. Meds reviewed verbally with patient.     HPI:  42 year old male with history of  polysubstance abuse including cocaine, heroin, marijuana, tobacco abuse, alcohol  presented to the hospital 07/24/2017 after being found unconscious,  intubated for airway protection,  cocaine positive tox screen, aspiration pneumonia echocardiogram With ejection fraction 30-35% He presents today for follow-up after recent discharge from the hospital, follow-up for cardiomyopathy  On today's visit he reports that he was doing "speed balls" Opiates,cocaine, by IV Has not used any recently  In the hospital blood culture positive x1 for methicillin resistant coag negative staph Previously treated with Zosyn, now on azithromycin IV  Started on medications for cardiomyopathy 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  PSH:   History reviewed. No pertinent surgical history.  Current Outpatient Medications  Medication Sig Dispense Refill  . furosemide (LASIX) 40 MG tablet Take 1 tablet (40 mg total) by mouth 2 (two) times daily. 30 tablet 0  . losartan (COZAAR) 100 MG tablet Take 1 tablet (100 mg total) by mouth daily. 30 tablet 0  . metoprolol succinate (TOPROL-XL) 25 MG 24 hr tablet Take 0.5 tablets (12.5 mg total) by mouth daily. 60 tablet 0  . nicotine (NICODERM CQ - DOSED IN MG/24 HOURS) 21  mg/24hr patch Place 1 patch (21 mg total) onto the skin daily. 28 patch 0  . spironolactone (ALDACTONE) 25 MG tablet Take 1 tablet (25 mg total) by mouth daily. 30 tablet 0   No current facility-administered medications for this visit.      Allergies:   Patient has no known allergies.   Social History:  The patient  reports that he has been smoking cigarettes.  He has a 25.00 pack-year smoking history. His smokeless tobacco use includes snuff. He reports that he uses drugs. Drugs: Cocaine and Heroin. He reports that he does not drink alcohol.   Family History:   family history includes CAD in his father.    Review of Systems: Review of Systems  Constitutional: Negative.   Respiratory: Negative.   Cardiovascular: Negative.   Gastrointestinal: Negative.   Musculoskeletal: Negative.   Neurological: Negative.   Psychiatric/Behavioral: Negative.   All other systems reviewed and are negative.    PHYSICAL EXAM: VS:  BP 100/69 (BP Location: Left Arm, Patient Position: Sitting, Cuff Size: Normal)   Pulse 79   Ht 5\' 9"  (1.753 m)   BMI 29.89 kg/m  , BMI Body mass index is 29.89 kg/m. GEN: Well nourished, well developed, in no acute distress  HEENT: normal  Neck: no JVD, carotid bruits, or masses Cardiac: RRR; no murmurs, rubs, or gallops,no edema  Respiratory:  clear to auscultation bilaterally, normal work of breathing GI: soft, nontender, nondistended, + BS MS: no deformity or atrophy  Skin: warm and dry, no rash Neuro:  Strength and  sensation are intact Psych: euthymic mood, full affect    Recent Labs: 07/22/2017: ALT 72 07/23/2017: Hemoglobin 13.0; Magnesium 1.7; Platelets 144 07/25/2017: BUN 16; Creatinine, Ser 0.93; Potassium 3.6; Sodium 141    Lipid Panel No results found for: CHOL, HDL, LDLCALC, TRIG    Wt Readings from Last 3 Encounters:  07/25/17 202 lb 6.4 oz (91.8 kg)  01/08/16 207 lb (93.9 kg)       ASSESSMENT AND PLAN:  Acute systolic congestive  heart failure (HCC) - Plan: EKG 12-Lead We will decrease his losartan down to 50 mg daily Continue low-dose metoprolol succinate, spironolactone Decrease Lasix down to 40 mg daily Blood pressure running low, weight running low end of his normal Feels he is at his baseline Repeat echocardiogram 2-3 months If ejection fraction does not improve back to baseline this may warrant stress testing  Acute respiratory failure with hypoxia and hypercapnia (HCC) Lungs clear, Pneumonia resolved  Accidental drug overdose, initial encounter Discussed his previous drug use Reports using cocaine and opiates injected Long discussion concerning complications from drug use Recommended cessation  Sepsis, due to unspecified organism (HCC) Symptoms improved, recommended he monitor his blood pressure  Aspiration pneumonia due to gastric secretions, unspecified laterality, unspecified part of lung (HCC) Lungs clear on exam, seems stable  polysubstance abuse Dating back to age 42 Relapse this week per the notes Cocaine positive, long history of alcohol abuse  Disposition:   F/U  3 months   Total encounter time more than 45 minutes  Greater than 50% was spent in counseling and coordination of care with the patient    Orders Placed This Encounter  Procedures  . EKG 12-Lead     Signed, Dossie Arbourim Vicky Schleich, M.D., Ph.D. 08/01/2017  [facility], [place] [phone]

## 2017-08-01 ENCOUNTER — Ambulatory Visit: Payer: BLUE CROSS/BLUE SHIELD | Admitting: Cardiovascular Disease

## 2017-08-01 ENCOUNTER — Encounter: Payer: Self-pay | Admitting: Cardiovascular Disease

## 2017-08-01 VITALS — BP 100/69 | HR 79 | Ht 69.0 in

## 2017-08-01 DIAGNOSIS — J9602 Acute respiratory failure with hypercapnia: Secondary | ICD-10-CM | POA: Diagnosis not present

## 2017-08-01 DIAGNOSIS — A419 Sepsis, unspecified organism: Secondary | ICD-10-CM

## 2017-08-01 DIAGNOSIS — J9601 Acute respiratory failure with hypoxia: Secondary | ICD-10-CM

## 2017-08-01 DIAGNOSIS — T50901A Poisoning by unspecified drugs, medicaments and biological substances, accidental (unintentional), initial encounter: Secondary | ICD-10-CM

## 2017-08-01 DIAGNOSIS — J690 Pneumonitis due to inhalation of food and vomit: Secondary | ICD-10-CM | POA: Diagnosis not present

## 2017-08-01 DIAGNOSIS — I5021 Acute systolic (congestive) heart failure: Secondary | ICD-10-CM | POA: Diagnosis not present

## 2017-08-01 MED ORDER — FUROSEMIDE 40 MG PO TABS: 40.0000 mg | ORAL_TABLET | Freq: Every day | ORAL | 6 refills | Status: DC

## 2017-08-01 MED ORDER — METOPROLOL SUCCINATE ER 25 MG PO TB24: 12.5000 mg | ORAL_TABLET | Freq: Every day | ORAL | 6 refills | Status: DC

## 2017-08-01 MED ORDER — SPIRONOLACTONE 25 MG PO TABS: 25.0000 mg | ORAL_TABLET | Freq: Every day | ORAL | 6 refills | Status: DC

## 2017-08-01 MED ORDER — LOSARTAN POTASSIUM 50 MG PO TABS: 50.0000 mg | ORAL_TABLET | Freq: Every day | ORAL | 6 refills | Status: DC

## 2017-08-01 NOTE — Patient Instructions (Addendum)
Medication Instructions:   Please decrease the lasix/furosemide down to one a day in the AM If you weight drops more, 197 or less Cut the lasix in 1/2 daily  Cut the losartan in 1/2 , 50 mg daily  Monitor blood pressure If it runs low call the office   Labwork:  No new labs needed  Testing/Procedures:  Echo in 2 to 3 months for cardiomyopathy   Follow-Up: It was a pleasure seeing you in the office today. Please call us if you have new issues that need to be addressed before your next appt.  [phone]  Your physician wants you to follow-up in: 3 months after echo  You will receive a reminder letter in the mail two months in advance. If you don't receive a letter, please call our office to schedule the follow-up appointment.  If you need a refill on your cardiac medications before your next appointment, please call your pharmacy.

## 2017-08-05 LAB — BLOOD GAS, ARTERIAL
ACID-BASE DEFICIT: 2.7 mmol/L — AB (ref 0.0–2.0)
BICARBONATE: 25.6 mmol/L (ref 20.0–28.0)
FIO2: 0.6
O2 SAT: 89.7 %
PATIENT TEMPERATURE: 37
PCO2 ART: 57 mmHg — AB (ref 32.0–48.0)
PO2 ART: 67 mmHg — AB (ref 83.0–108.0)
pH, Arterial: 7.26 — ABNORMAL LOW (ref 7.350–7.450)

## 2017-08-07 NOTE — Telephone Encounter (Signed)
Spoke with Allen Bass in regards to making an appointment. He feels like he is being followed closely by his PCP and cardiologist. He does not want to make an appointment.

## 2017-08-21 ENCOUNTER — Telehealth: Payer: Self-pay | Admitting: Cardiovascular Disease

## 2017-08-21 MED ORDER — FUROSEMIDE 40 MG PO TABS: 40.0000 mg | ORAL_TABLET | Freq: Every day | ORAL | 3 refills | Status: DC

## 2017-08-21 NOTE — Telephone Encounter (Signed)
Spoke with patient and he is taking Furosemide 40 mg once daily and has been having good results. He denies any other concerns at this time and just needs refill. Confirmed pharmacy of choice and refill sent to that location. Instructed him to please call if he should have any further questions.

## 2017-08-21 NOTE — Telephone Encounter (Signed)
Pt c/o medication issue:  1. Name of Medication: Lasix    2. How are you currently taking this medication (dosage and times per day)? 40 mg po q day   3. Are you having a reaction (difficulty breathing--STAT)? no  4. What is your medication issue? Patient states he has been taking for about a month and his dosage was scaled back to half dose.  He is now out and assumes that he will not have to take forever.  Patient wants to clarify if he is done taking or if he should continue

## 2017-08-26 ENCOUNTER — Encounter: Payer: Self-pay | Admitting: Cardiovascular Disease

## 2017-08-28 ENCOUNTER — Encounter: Payer: Self-pay | Admitting: Cardiovascular Disease

## 2017-10-17 ENCOUNTER — Other Ambulatory Visit: Payer: Self-pay | Admitting: Cardiovascular Disease

## 2017-10-17 DIAGNOSIS — J9602 Acute respiratory failure with hypercapnia: Secondary | ICD-10-CM

## 2017-10-17 DIAGNOSIS — I5021 Acute systolic (congestive) heart failure: Secondary | ICD-10-CM

## 2017-10-17 DIAGNOSIS — J9601 Acute respiratory failure with hypoxia: Secondary | ICD-10-CM

## 2017-10-30 ENCOUNTER — Other Ambulatory Visit: Payer: BLUE CROSS/BLUE SHIELD

## 2017-10-31 ENCOUNTER — Ambulatory Visit (INDEPENDENT_AMBULATORY_CARE_PROVIDER_SITE_OTHER): Payer: BLUE CROSS/BLUE SHIELD

## 2017-10-31 ENCOUNTER — Other Ambulatory Visit: Payer: Self-pay

## 2017-10-31 DIAGNOSIS — I5021 Acute systolic (congestive) heart failure: Secondary | ICD-10-CM | POA: Diagnosis not present

## 2017-10-31 DIAGNOSIS — J9602 Acute respiratory failure with hypercapnia: Secondary | ICD-10-CM | POA: Diagnosis not present

## 2017-10-31 DIAGNOSIS — J9601 Acute respiratory failure with hypoxia: Secondary | ICD-10-CM

## 2018-02-25 ENCOUNTER — Other Ambulatory Visit: Payer: Self-pay | Admitting: Cardiovascular Disease

## 2018-03-23 ENCOUNTER — Other Ambulatory Visit: Payer: Self-pay | Admitting: Cardiovascular Disease

## 2018-04-26 ENCOUNTER — Other Ambulatory Visit: Payer: Self-pay | Admitting: Cardiovascular Disease

## 2018-05-31 ENCOUNTER — Other Ambulatory Visit: Payer: Self-pay | Admitting: Cardiovascular Disease

## 2018-07-01 ENCOUNTER — Other Ambulatory Visit: Payer: Self-pay | Admitting: Cardiovascular Disease

## 2018-07-02 NOTE — Telephone Encounter (Signed)
Please review for refill.  

## 2018-07-03 NOTE — Telephone Encounter (Signed)
Ok to refill # 30 w/ 1 refill- he needs to contact the office to follow up with Dr. Mariah MillingGollan.   Thanks!

## 2018-07-05 ENCOUNTER — Other Ambulatory Visit: Payer: Self-pay

## 2018-07-09 NOTE — Progress Notes (Signed)
Cardiology Office Note  Date:  07/10/2018   ID:  Allen Bass, DOB 08-Mar-1976, MRN [id number]  PCP:  Patient, No Pcp Per   Chief Complaint  Patient presents with  . other    3 mo follow up. Medications reviewed verbally."Doing well"    HPI:  42 year old male with history of  polysubstance abuse including cocaine, heroin, marijuana, tobacco abuse, alcohol  presented to the hospital 07/24/2017 after being found unconscious,  intubated for airway protection,  cocaine positive tox screen, aspiration pneumonia echocardiogram With ejection fraction 30-35% EF now up to 45 to 50% 10/2017 He presents today for follow-up after recent discharge from the hospital, follow-up for cardiomyopathy  In follow-up today reports that he is doing well Compliant with his medications Ran out of losartan yesterday Denies any lower extremity edema abdominal bloating PND orthopnea  Discussed previous echocardiogram showing improvement from 30 to 35% In April 2019 up to 45 to 50% No repeat study since that time  Active Weight is stable 202 over the past year On last clinic visit was not using any drugs  On hospital admission December 2018, doing "speed balls" Opiates,cocaine, by IV In the hospital blood culture positive x1 for methicillin resistant coag negative staph Previously treated with Zosyn, now on azithromycin IV Started on medications for cardiomyopathy presumed to be dilated drug-induced  EKG personally reviewed by myself on todays visit Shows normal sinus rhythm with rate 67 bpm no significant ST or T wave changes  PMH:   has a past medical history of Polysubstance abuse (HCC).  Dilated cardiomyopathy  PSH:   History reviewed. No pertinent surgical history.  Current Outpatient Medications  Medication Sig Dispense Refill  . furosemide (LASIX) 40 MG tablet Take 1 tablet (40 mg total) by mouth daily. 90 tablet 3  . losartan (COZAAR) 50 MG tablet TAKE 1 TABLET BY MOUTH EVERY  DAY 30 tablet 0  . metoprolol succinate (TOPROL-XL) 25 MG 24 hr tablet Take 0.5 tablets (12.5 mg total) by mouth daily. 30 tablet 6  . spironolactone (ALDACTONE) 25 MG tablet TAKE 1 TABLET BY MOUTH EVERY DAY 90 tablet 1   No current facility-administered medications for this visit.     Allergies:   Patient has no known allergies.   Social History:  The patient  reports that he has been smoking cigarettes. He has a 25.00 pack-year smoking history. His smokeless tobacco use includes snuff. He reports current drug use. Drugs: Cocaine and Heroin. He reports that he does not drink alcohol.   Family History:   family history includes CAD in his father.    Review of Systems: Review of Systems  Constitutional: Negative.   Respiratory: Negative.   Cardiovascular: Negative.   Gastrointestinal: Negative.   Musculoskeletal: Negative.   Neurological: Negative.   Psychiatric/Behavioral: Negative.   All other systems reviewed and are negative.    PHYSICAL EXAM: VS:  BP 118/66 (BP Location: Left Arm, Patient Position: Sitting, Cuff Size: Normal)   Pulse 67   Ht 5\' 10"  (1.778 m)   Wt 202 lb (91.6 kg)   BMI 28.98 kg/m  , BMI Body mass index is 28.98 kg/m. Constitutional:  oriented to person, place, and time. No distress.  HENT:  Head: Grossly normal Eyes:  no discharge. No scleral icterus.  Neck: No JVD, no carotid bruits  Cardiovascular: Regular rate and rhythm, no murmurs appreciated Pulmonary/Chest: Clear to auscultation bilaterally, no wheezes or rails Abdominal: Soft.  no distension.  no tenderness.  Musculoskeletal: Normal range  of motion Neurological:  normal muscle tone. Coordination normal. No atrophy Skin: Skin warm and dry Psychiatric: normal affect, pleasant   Recent Labs: 07/22/2017: ALT 72 07/23/2017: Hemoglobin 13.0; Magnesium 1.7; Platelets 144 07/25/2017: BUN 16; Creatinine, Ser 0.93; Potassium 3.6; Sodium 141    Lipid Panel No results found for: CHOL, HDL,  LDLCALC, TRIG    Wt Readings from Last 3 Encounters:  07/10/18 202 lb (91.6 kg)  07/25/17 202 lb 6.4 oz (91.8 kg)  01/08/16 207 lb (93.9 kg)       ASSESSMENT AND PLAN:  Acute systolic congestive heart failure (HCC) - Plan: EKG 12-Lead Continue current medications Appears euvolemic Discussed risk of diagnosis of CHF He is concerned about mortality levels that he has read about  Dilated cardiomyopathy Echocardiogram was offered He will call us if he would like this done Continue current medications check a BMP today  Acute respiratory failure with hypoxia and hypercapnia (HCC) Previously with pneumonia, no recent infections  Accidental drug overdose, initial encounter We previously discussed drug abuse Previously with cocaine and opiates injected Recommended cessation  polysubstance abuse Dating back to age 42 Relapse this week per the notes Cocaine positive, long history of alcohol abuse  Disposition:   F/U  12 months   Total encounter time more than 25 minutes  Greater than 50% was spent in counseling and coordination of care with the patient    Orders Placed This Encounter  Procedures  . EKG 12-Lead     Signed, Dossie Arbourim Markeise Mathews, M.D., Ph.D. 07/10/2018  [facility], [place] [phone]

## 2018-07-10 ENCOUNTER — Encounter: Payer: Self-pay | Admitting: Cardiovascular Disease

## 2018-07-10 ENCOUNTER — Ambulatory Visit (INDEPENDENT_AMBULATORY_CARE_PROVIDER_SITE_OTHER): Payer: BLUE CROSS/BLUE SHIELD | Admitting: Cardiovascular Disease

## 2018-07-10 VITALS — BP 118/66 | HR 67 | Ht 70.0 in | Wt 202.0 lb

## 2018-07-10 DIAGNOSIS — J9601 Acute respiratory failure with hypoxia: Secondary | ICD-10-CM

## 2018-07-10 DIAGNOSIS — J9602 Acute respiratory failure with hypercapnia: Secondary | ICD-10-CM

## 2018-07-10 DIAGNOSIS — T50901A Poisoning by unspecified drugs, medicaments and biological substances, accidental (unintentional), initial encounter: Secondary | ICD-10-CM | POA: Diagnosis not present

## 2018-07-10 DIAGNOSIS — I420 Dilated cardiomyopathy: Secondary | ICD-10-CM

## 2018-07-10 MED ORDER — LOSARTAN POTASSIUM 50 MG PO TABS: 50.0000 mg | ORAL_TABLET | Freq: Every day | ORAL | 3 refills | Status: DC

## 2018-07-10 MED ORDER — FUROSEMIDE 40 MG PO TABS: 40.0000 mg | ORAL_TABLET | Freq: Every day | ORAL | 3 refills | Status: DC

## 2018-07-10 MED ORDER — SPIRONOLACTONE 25 MG PO TABS: 25.0000 mg | ORAL_TABLET | Freq: Every day | ORAL | 3 refills | Status: DC

## 2018-07-10 MED ORDER — METOPROLOL SUCCINATE ER 25 MG PO TB24: 12.5000 mg | ORAL_TABLET | Freq: Every day | ORAL | 3 refills | Status: DC

## 2018-07-10 NOTE — Patient Instructions (Addendum)
Medication Instructions:  No changes  Refills sent in for you today.   If you need a refill on your cardiac medications before your next appointment, please call your pharmacy.    Lab work: BMP done today   If you have labs (blood work) drawn today and your tests are completely normal, you will receive your results only by: Marland Kitchen. MyChart Message (if you have MyChart) OR . A paper copy in the mail If you have any lab test that is abnormal or we need to change your treatment, we will call you to review the results.   Testing/Procedures: No new testing needed   Follow-Up: At [facility], you and your health needs are our priority.  As part of our continuing mission to provide you with exceptional heart care, we have created designated Provider Care Teams.  These Care Teams include your primary Cardiologist (physician) and Advanced Practice Providers (APPs -  Physician Assistants and Nurse Practitioners) who all work together to provide you with the care you need, when you need it.  . You will need a follow up appointment in 12 months .   Please call our office 2 months in advance to schedule this appointment.    . Providers on your designated Care Team:   . Nicolasa Duckinghristopher Berge, NP . Eula Listenyan Dunn, PA-C . Marisue IvanJacquelyn Visser, PA-C  Any Other Special Instructions Will Be Listed Below (If Applicable).  For educational health videos Log in to : www.myemmi.com Or : [email or web address], password : [email or web address]  [facility] [phone]  [address] [place] [id number]

## 2018-07-11 LAB — BASIC METABOLIC PANEL
BUN / CREAT RATIO: 19 (ref 9–20)
BUN: 16 mg/dL (ref 6–24)
CHLORIDE: 98 mmol/L (ref 96–106)
CO2: 22 mmol/L (ref 20–29)
Calcium: 9.9 mg/dL (ref 8.7–10.2)
Creatinine, Ser: 0.86 mg/dL (ref 0.76–1.27)
GFR calc non Af Amer: 105 mL/min/{1.73_m2} (ref >59)
GFR, EST AFRICAN AMERICAN: 122 mL/min/{1.73_m2} (ref >59)
Glucose: 107 mg/dL — ABNORMAL HIGH (ref 65–99)
POTASSIUM: 4.1 mmol/L (ref 3.5–5.2)
Sodium: 138 mmol/L (ref 134–144)

## 2018-08-16 ENCOUNTER — Telehealth: Payer: Self-pay | Admitting: Cardiovascular Disease

## 2018-08-16 NOTE — Telephone Encounter (Signed)
Called patient. States he never heard anything back about whether he should continue his furomside after office visit in December. Dr Mariah Milling drew lab work and was going to let him know. When looking back at lab results, I saw that the results were normal and Dr Mariah Milling did not make any changes with medications at that time. Advised patient to continue medications as prescribed and he verbalized understanding.

## 2018-08-16 NOTE — Telephone Encounter (Signed)
Pt c/o medication issue:  1. Name of Medication: furosemide (LASIX)  2. How are you currently taking this medication (dosage and times per day)? 40 MG 1 tablet daily   3. Are you having a reaction (difficulty breathing--STAT)? no  4. What is your medication issue? Patient states Dr Mariah Milling mentioned at last visit 07/10/18 that he may want to stop the Lasix medication.  Patient never heard and just wanted to clarify he should still be continuing with medication.  Please call to discuss.

## 2018-08-22 ENCOUNTER — Other Ambulatory Visit: Payer: Self-pay | Admitting: *Deleted

## 2018-08-22 MED ORDER — LOSARTAN POTASSIUM 50 MG PO TABS: 50.0000 mg | ORAL_TABLET | Freq: Every day | ORAL | 3 refills | Status: DC

## 2019-05-15 IMAGING — DX DG ABD PORTABLE 1V
1 series · 1 of 1 positions shown · non-contrast
Comparison: None.

CLINICAL DATA: Feeding tube placement.

EXAM:
PORTABLE ABDOMEN - 1 VIEW

[abdomen kub]
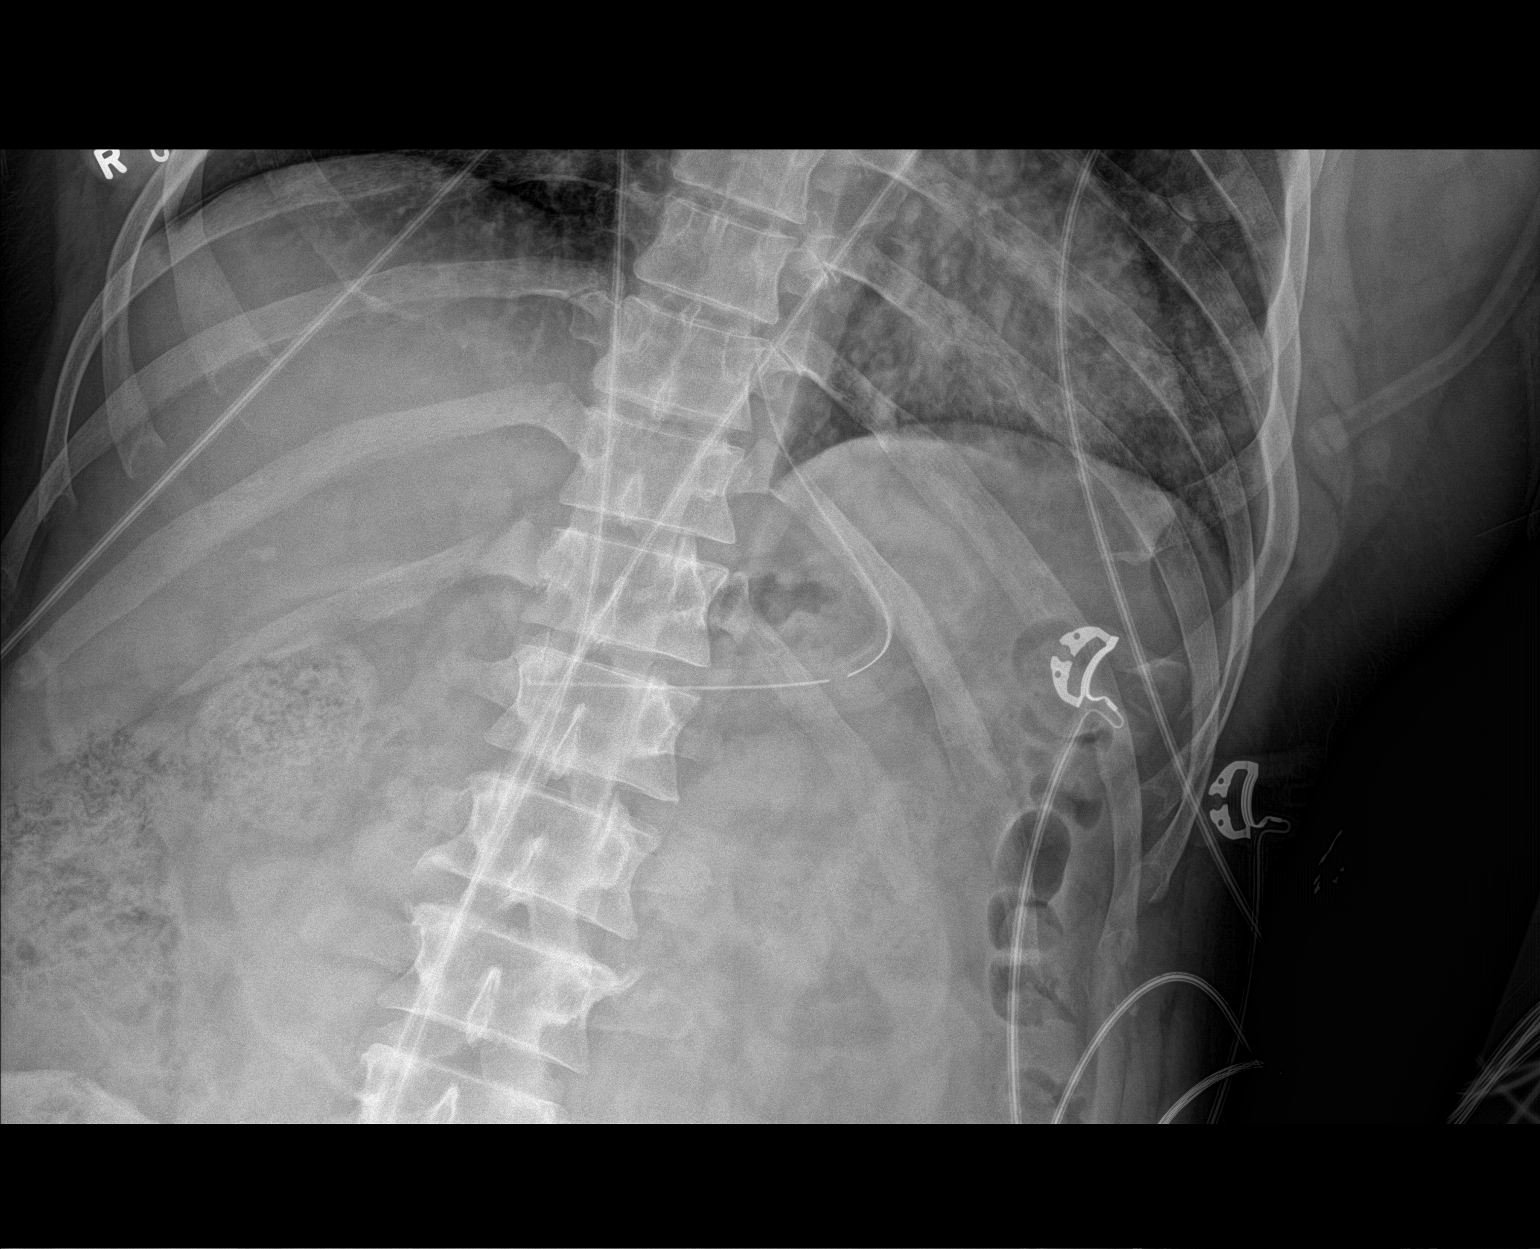

[1 of 1 positions shown; findings below may reference images not displayed]

FINDINGS: The bowel gas pattern is normal. Nasogastric tube is identified with
distal tip in the mid stomach. Patchy consolidation of the bilateral
lungs are noted.
IMPRESSION: Nasogastric tube is identified with distal tip in the mid stomach.

## 2019-05-16 IMAGING — DX DG CHEST 1V PORT
1 series · 1 of 1 positions shown · non-contrast
Comparison: July 21, 2017

CLINICAL DATA: Acute respiratory failure

EXAM:
PORTABLE CHEST 1 VIEW

[chest ap]
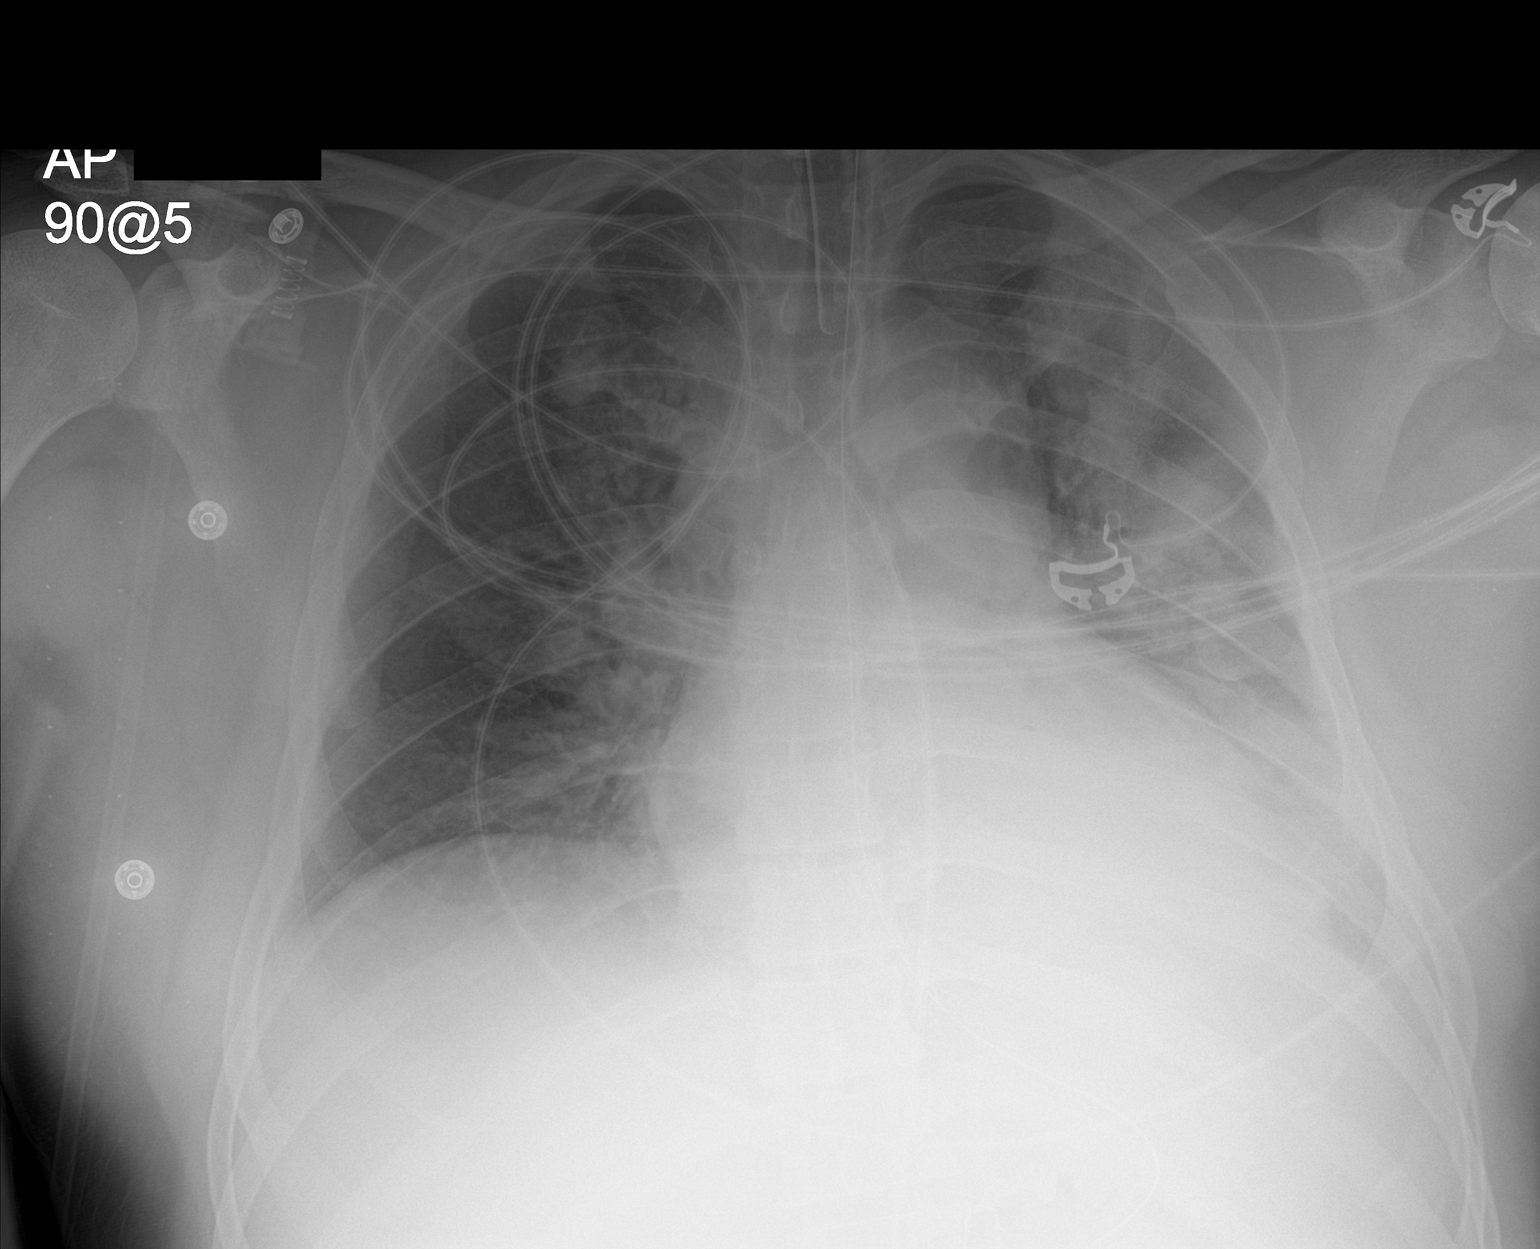

[1 of 1 positions shown; findings below may reference images not displayed]

FINDINGS: The heart size and mediastinal contours are stable. Endotracheal
tube is identified distal tip 4 cm from carina. Nasogastric tube is
identified with distal tip not included on film but is at least in
the stomach. There is pulmonary edema. There is interval developed
consolidation of the left lung base. The visualized skeletal
structures are stable.
IMPRESSION: Persistent pulmonary edema, worsened compared prior exam.

Interval developed consolidation of left lung base suspicious for
developing pneumonia with superimposed pleural effusion.

## 2019-05-17 IMAGING — DX DG CHEST 1V PORT
1 series · 1 of 1 positions shown · non-contrast
Comparison: 07/22/2017

CLINICAL DATA: Acute respiratory failure

EXAM:
PORTABLE CHEST 1 VIEW

[chest ap]
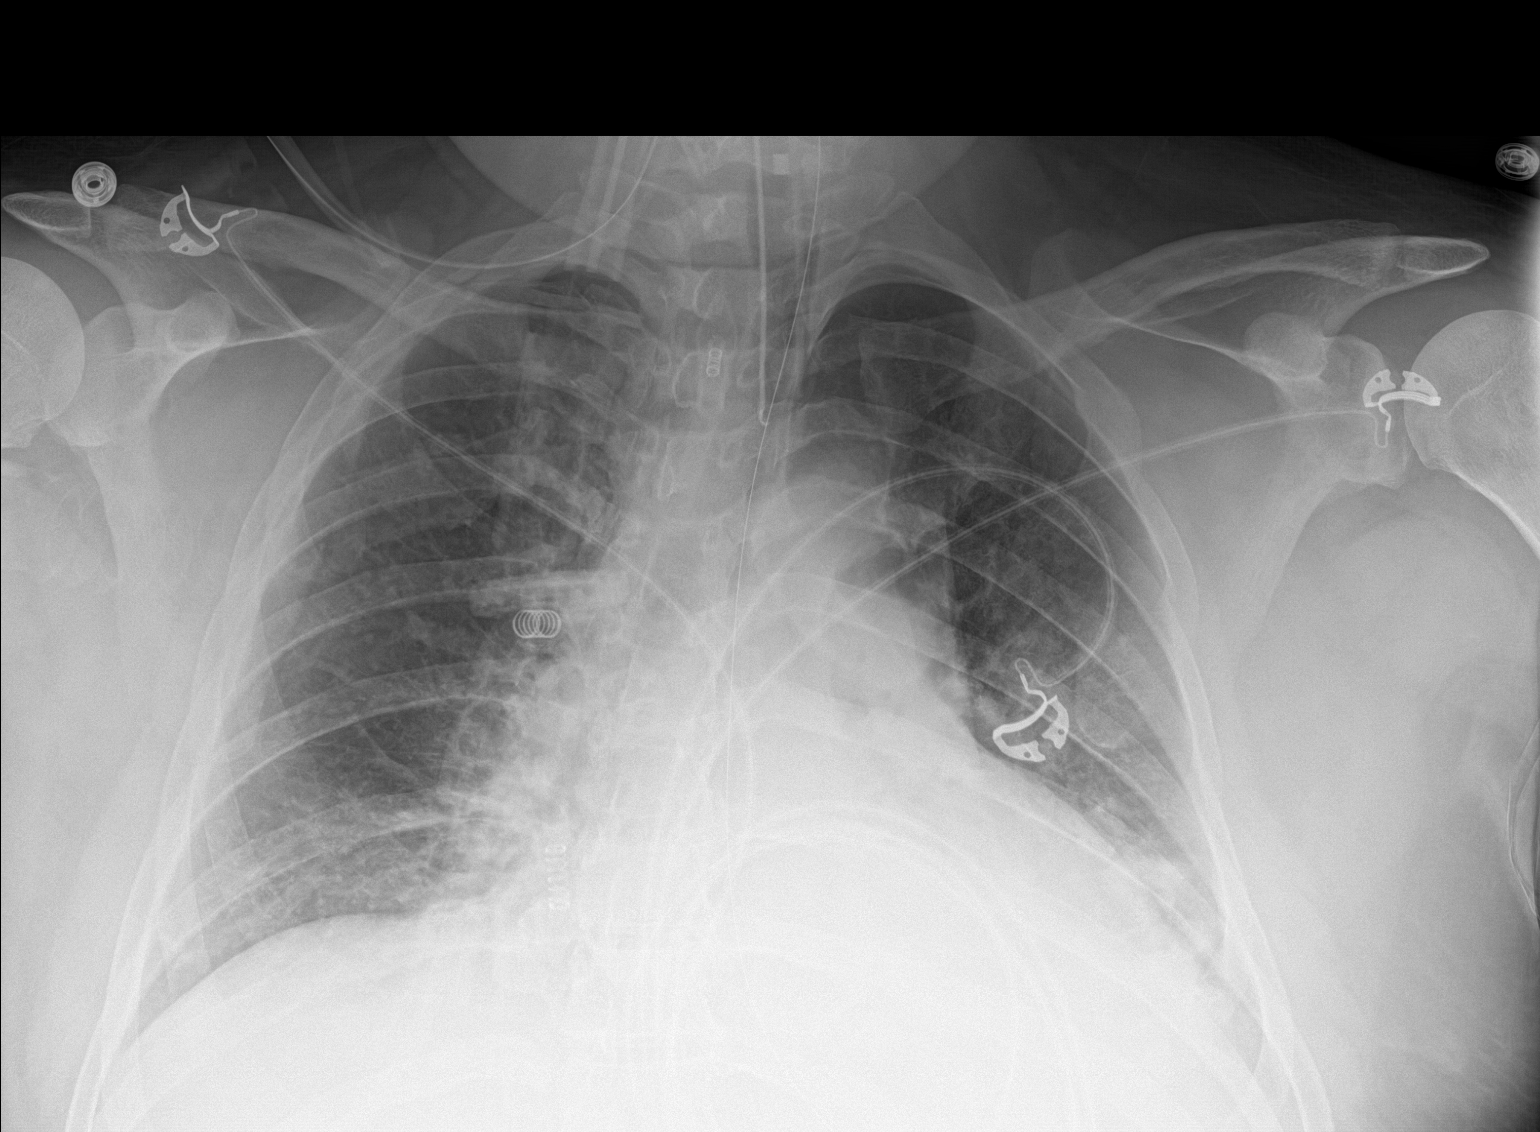

[1 of 1 positions shown; findings below may reference images not displayed]

FINDINGS: Cardiac shadow is enlarged. Endotracheal tube and nasogastric
catheter are stable. There is been significant improved aeration in
the left lung although persistent infiltrative changes are noted in
the left base. No pneumothorax is seen. No bony abnormality is
noted.
IMPRESSION: Improved aeration on the left with persistent left basilar changes.

## 2019-08-13 ENCOUNTER — Other Ambulatory Visit: Payer: Self-pay | Admitting: Cardiovascular Disease

## 2019-08-13 NOTE — Telephone Encounter (Signed)
Please call to schedule overdue 12 mo F/U with Dr. Mariah Milling. Thank you!

## 2019-08-13 NOTE — Telephone Encounter (Signed)
Unable to LVM for pt to schedule

## 2019-08-14 ENCOUNTER — Other Ambulatory Visit: Payer: Self-pay | Admitting: Cardiovascular Disease

## 2019-08-19 ENCOUNTER — Ambulatory Visit: Payer: BLUE CROSS/BLUE SHIELD | Admitting: Family

## 2019-08-19 NOTE — Progress Notes (Deleted)
    Office Visit    Patient Name: Allen Bass Date of Encounter: 08/19/2019  Primary Care Provider:  Patient, No Pcp Per Primary Cardiologist:  No primary care provider on file. Electrophysiologist:  None   Chief Complaint    Allen Bass is a 44 y.o. male with a hx of systolic congestive heart failure, dilated cardiomyopathy, polysubstance abuse presents today for follow up of heart failure.   Past Medical History    Past Medical History:  Diagnosis Date  . Polysubstance abuse (HCC)    a. ongoing cocaine, heroin, marijuana, and tobacco abuse   No past surgical history on file.  Allergies  No Known Allergies  History of Present Illness    Allen Bass is a 44 y.o. male with a hx of systolic congestive heart failure, dilated cardiomyopathy, polysubstance abuse last seen 07/10/2018 by Dr. Mariah Milling.  Polysubstance abuse hx including cocaine, heroin, marijuana, tobacco, alcohol. Presented to hospital 07/24/17 after being found unconscious. Cocaine positive tox screen. Aspiration PNA. Echo EF 30-35%.   Updated echo 10/2017 with EF 45-50%.   EKGs/Labs/Other Studies Reviewed:   The following studies were reviewed today: ***  EKG:  EKG is *** ordered today.  The ekg ordered today demonstrates ***  Recent Labs: No results found for requested labs within last 8760 hours.  Recent Lipid Panel No results found for: CHOL, TRIG, HDL, CHOLHDL, VLDL, LDLCALC, LDLDIRECT  Home Medications   No outpatient medications have been marked as taking for the 08/19/19 encounter (Appointment) with Alver Sorrow, NP.      Review of Systems    ***   ROS All other systems reviewed and are otherwise negative except as noted above.  Physical Exam    VS:  There were no vitals taken for this visit. , BMI There is no height or weight on file to calculate BMI. GEN: Well nourished, well developed, in no acute distress. HEENT: normal. Neck: Supple, no JVD,  carotid bruits, or masses. Cardiac: ***RRR, no murmurs, rubs, or gallops. No clubbing, cyanosis, edema.  ***Radials/DP/PT 2+ and equal bilaterally.  Respiratory:  ***Respirations regular and unlabored, clear to auscultation bilaterally. GI: Soft, nontender, nondistended, BS + x 4. MS: No deformity or atrophy. Skin: Warm and dry, no rash. Neuro:  Strength and sensation are intact. Psych: Normal affect.  Accessory Clinical Findings    ECG personally reviewed by me today - *** - no acute changes.  Assessment & Plan    1. ***  Disposition: Follow up {follow up:15908} with ***   Alver Sorrow, NP 08/19/2019, 7:57 AM

## 2019-08-20 NOTE — Progress Notes (Signed)
Cardiology Office Note  Date:  08/21/2019   ID:  Allen Bass, Allen Bass 08-03-1975, MRN [id number]  PCP:  Patient, No Pcp Per   Chief Complaint  Patient presents with  . other    12 month f/u no complaints today. Meds reviewed verbally with pt.    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  No regular exercise, weight is higher than he would like Denies shortness of breath on exertion, no chest pain with activity, No lower extremity edema, PND orthopnea, no abdominal swelling  Presumably medications have been refilled his primary care, Dr. Laural Benes at [facility] Per the patient, he no longer sees primary care as it is not covered by his insurance He does not have primary care Running out of his medications  There is no lab work available for over 1 year  EKG personally reviewed by myself on todays visit Shows normal sinus rhythm with rate 55 bpm no significant ST-T wave changes  PMH:   has a past medical history of Polysubstance abuse (HCC).  PSH:   History reviewed. No pertinent surgical history.  Current Outpatient Medications  Medication Sig Dispense Refill  . buprenorphine-naloxone (SUBOXONE) 8-2 mg SUBL SL tablet Place 1  tablet under the tongue daily.    . furosemide (LASIX) 40 MG tablet Take 1 tablet (40 mg total) by mouth daily. Please call to schedule office visit for further refills. Thank you! 30 tablet 0  . losartan (COZAAR) 50 MG tablet Take 1 tablet (50 mg total) by mouth daily. 90 tablet 3  . metoprolol succinate (TOPROL-XL) 25 MG 24 hr tablet Take 0.5 tablets (12.5 mg total) by mouth daily. Please call to schedule appointment for further refills. Thank you! 15 tablet 0  . spironolactone (ALDACTONE) 25 MG tablet Take 1 tablet (25 mg total) by mouth daily. 90 tablet 3   No current facility-administered medications for this visit.     Allergies:   Patient has no known allergies.   Social History:  The patient  reports that he has been smoking cigarettes. He has a 12.50 pack-year smoking history. His smokeless tobacco use includes snuff. He reports current drug use. Drugs: Cocaine and Heroin. He reports that he does not drink alcohol.   Family History:   family history includes CAD in his father.    Review of Systems: Review of Systems  Constitutional: Negative.   HENT: Negative.   Respiratory: Negative.   Cardiovascular: Negative.   Gastrointestinal: Negative.   Musculoskeletal: Negative.   Neurological: Negative.   Psychiatric/Behavioral: Negative.   All other systems reviewed and are negative.   PHYSICAL EXAM: VS:  BP 108/80 (BP Location: Left Arm, Patient Position: Sitting, Cuff Size: Normal)   Pulse (!) 55   Ht 5\' 10"  (1.778 m)   Wt 204 lb 12 oz (92.9 kg)   SpO2  98%   BMI 29.38 kg/m  , BMI Body mass index is 29.38 kg/m. GEN: Well nourished, well developed, in no acute distress HEENT: normal Neck: no JVD, carotid bruits, or masses Cardiac: RRR; no murmurs, rubs, or gallops,no edema  Respiratory:  clear to auscultation bilaterally, normal work of breathing GI: soft, nontender, nondistended, + BS MS: no deformity or atrophy Skin: warm and dry, no rash Neuro:  Strength and  sensation are intact Psych: euthymic mood, full affect    Recent Labs: No results found for requested labs within last 8760 hours.    Lipid Panel No results found for: CHOL, HDL, LDLCALC, TRIG    Wt Readings from Last 3 Encounters:  08/21/19 204 lb 12 oz (92.9 kg)  07/25/17 202 lb 6.4 oz (91.8 kg)  01/08/16 207 lb (93.9 kg)       ASSESSMENT AND PLAN:  Problem List Items Addressed This Visit    Acute respiratory failure with hypoxia and hypercapnia ([place])    Other Visit Diagnoses    Dilated cardiomyopathy ([place])    -  Primary     Dilated cardiomyopathy Noted 2019, no further work-up since that time as he was lost to follow-up until today  felt to be secondary to  alcohol, drug abuse including cocaine (tox screen positive) Possible stress cardiomyopathy from intubation and pneumonia, hypoxia.  We have given him 1 month of refills losartan 50 metoprolol succinate 12.5 daily spironolactone 25 Lasix 40 Recommended lab work ASAP, CMP at least He is going to establish care with [facility] cardiology We did offer echocardiogram, he has declined as it is not in network  Elevated troponin In 2019 in the setting of aspiration pneumonia, cardiomyopathy echo, last in 10/2017 Recommend he have echocardiogram when he establishes care with new cardiologist within his network  polysubstance abuse Dating back to age 4 Cocaine positive, long history of alcohol abuse 2019 Reports he has been substance free this year on Suboxone  Disposition: Reports he has to have follow-up in [place] or [place] for his insurance/[employer]   Total encounter time more than 25 minutes  Greater than 50% was spent in counseling and coordination of care with the patient  Signed, Esmond Plants, M.D., Ph.D. [place], [place]

## 2019-08-21 ENCOUNTER — Other Ambulatory Visit: Payer: Self-pay

## 2019-08-21 ENCOUNTER — Encounter: Payer: Self-pay | Admitting: Cardiovascular Disease

## 2019-08-21 ENCOUNTER — Ambulatory Visit (INDEPENDENT_AMBULATORY_CARE_PROVIDER_SITE_OTHER): Payer: BLUE CROSS/BLUE SHIELD | Admitting: Cardiovascular Disease

## 2019-08-21 VITALS — BP 108/80 | HR 55 | Ht 70.0 in | Wt 204.8 lb

## 2019-08-21 DIAGNOSIS — J9601 Acute respiratory failure with hypoxia: Secondary | ICD-10-CM | POA: Diagnosis not present

## 2019-08-21 DIAGNOSIS — J9602 Acute respiratory failure with hypercapnia: Secondary | ICD-10-CM | POA: Diagnosis not present

## 2019-08-21 DIAGNOSIS — I420 Dilated cardiomyopathy: Secondary | ICD-10-CM

## 2019-08-21 MED ORDER — LOSARTAN POTASSIUM 50 MG PO TABS: 50.0000 mg | ORAL_TABLET | Freq: Every day | ORAL | 0 refills | Status: DC

## 2019-08-21 MED ORDER — METOPROLOL SUCCINATE ER 25 MG PO TB24: 12.5000 mg | ORAL_TABLET | Freq: Every day | ORAL | 0 refills | Status: DC

## 2019-08-21 NOTE — Patient Instructions (Signed)
CMP with [facility] to go paper   Medication Instructions:  Continue metoprolol and losartan Would look to wean off lasix in the the furture Cleda Daub ?, depends on labs  If you need a refill on your cardiac medications before your next appointment, please call your pharmacy.    Lab work: As above   If you have labs (blood work) drawn today and your tests are completely normal, you will receive your results only by: Marland Kitchen MyChart Message (if you have MyChart) OR . A paper copy in the mail If you have any lab test that is abnormal or we need to change your treatment, we will call you to review the results.   Testing/Procedures: No new testing needed   Follow-Up: At [facility], you and your health needs are our priority.  As part of our continuing mission to provide you with exceptional heart care, we have created designated Provider Care Teams.  These Care Teams include your primary Cardiologist (physician) and Advanced Practice Providers (APPs -  Physician Assistants and Nurse Practitioners) who all work together to provide you with the care you need, when you need it.  . You will need a follow up appointment as needed  . Providers on your designated Care Team:   . Nicolasa Ducking, NP . Eula Listen, PA-C . Marisue Ivan, PA-C  Any Other Special Instructions Will Be Listed Below (If Applicable).  For educational health videos Log in to : [email or web address] Or : [email or web address], password : [email or web address]

## 2019-09-07 ENCOUNTER — Other Ambulatory Visit: Payer: Self-pay | Admitting: Cardiovascular Disease

## 2019-09-14 ENCOUNTER — Other Ambulatory Visit: Payer: Self-pay | Admitting: Cardiovascular Disease

## 2019-11-02 ENCOUNTER — Other Ambulatory Visit: Payer: Self-pay | Admitting: Cardiovascular Disease

## 2019-12-06 ENCOUNTER — Other Ambulatory Visit: Payer: Self-pay | Admitting: Cardiovascular Disease

## 2019-12-10 ENCOUNTER — Other Ambulatory Visit: Payer: Self-pay | Admitting: Cardiovascular Disease

## 2020-01-30 ENCOUNTER — Telehealth: Payer: Self-pay | Admitting: Cardiovascular Disease

## 2020-01-30 NOTE — Telephone Encounter (Signed)
*  STAT* If patient is at the pharmacy, call can be transferred to refill team.   1. Which medications need to be refilled? (please list name of each medication and dose if known)    Metoprolol 12.5 mg po q d  spironolactone 25 mg po q d  Furosemide 40 mg po q d  Losartan 50 mg po q d    2. Which pharmacy/location (including street and city if local pharmacy) is medication to be sent to?  [facility] [address] [place]     3. Do they need a 30 day or 90 day supply?   90

## 2020-01-30 NOTE — Telephone Encounter (Signed)
Notified Allen Bass regarding his medication refill request.  The patient was told by Dr. Mariah Milling at his last office visit to establish care with a [facility]. The patients insurance insurance [employer] is not in network with [facility] and the patient would have to pay out of pocket $400.00. The patient can't afford to pay this and he has not established care with [facility]iology and would like to know if we can refill his medications this time. Please advise.

## 2020-01-31 MED ORDER — METOPROLOL SUCCINATE ER 25 MG PO TB24: 12.5000 mg | ORAL_TABLET | Freq: Every day | ORAL | 3 refills | Status: AC

## 2020-01-31 MED ORDER — FUROSEMIDE 40 MG PO TABS: 40.0000 mg | ORAL_TABLET | Freq: Every day | ORAL | 3 refills | Status: AC

## 2020-01-31 MED ORDER — SPIRONOLACTONE 25 MG PO TABS: 25.0000 mg | ORAL_TABLET | Freq: Every day | ORAL | 3 refills | Status: AC

## 2020-01-31 MED ORDER — LOSARTAN POTASSIUM 50 MG PO TABS: 50.0000 mg | ORAL_TABLET | Freq: Every day | ORAL | 3 refills | Status: AC

## 2020-02-06 ENCOUNTER — Emergency Department
Admission: EM | Admit: 2020-02-06 | Discharge: 2020-02-06 | Disposition: A | Discharge status: Home / Self Care | Payer: BLUE CROSS/BLUE SHIELD | Attending: Emergency Medicine | Admitting: Emergency Medicine

## 2020-02-06 ENCOUNTER — Encounter: Payer: Self-pay | Admitting: Emergency Medicine

## 2020-02-06 ENCOUNTER — Emergency Department: Payer: BLUE CROSS/BLUE SHIELD

## 2020-02-06 ENCOUNTER — Other Ambulatory Visit: Payer: Self-pay

## 2020-02-06 DIAGNOSIS — I509 Heart failure, unspecified: Secondary | ICD-10-CM | POA: Insufficient documentation

## 2020-02-06 DIAGNOSIS — R079 Chest pain, unspecified: Secondary | ICD-10-CM | POA: Insufficient documentation

## 2020-02-06 HISTORY — DX: Heart failure, unspecified: I50.9

## 2020-02-06 LAB — BASIC METABOLIC PANEL
Anion gap: 13 (ref 5–15)
BUN: 16 mg/dL (ref 6–20)
CO2: 25 mmol/L (ref 22–32)
Calcium: 9.9 mg/dL (ref 8.9–10.3)
Chloride: 99 mmol/L (ref 98–111)
Creatinine, Ser: 0.95 mg/dL (ref 0.61–1.24)
GFR calc Af Amer: >60 mL/min (ref >60)
GFR calc non Af Amer: >60 mL/min (ref >60)
Glucose, Bld: 102 mg/dL — ABNORMAL HIGH (ref 70–99)
Potassium: 4 mmol/L (ref 3.5–5.1)
Sodium: 137 mmol/L (ref 135–145)

## 2020-02-06 LAB — CBC
HCT: 47.4 % (ref 39.0–52.0)
Hemoglobin: 16.4 g/dL (ref 13.0–17.0)
MCH: 28.8 pg (ref 26.0–34.0)
MCHC: 34.6 g/dL (ref 30.0–36.0)
MCV: 83.3 fL (ref 80.0–100.0)
Platelets: 260 10*3/uL (ref 150–400)
RBC: 5.69 MIL/uL (ref 4.22–5.81)
RDW: 13.1 % (ref 11.5–15.5)
WBC: 7.2 10*3/uL (ref 4.0–10.5)
nRBC: 0 % (ref 0.0–0.2)

## 2020-02-06 LAB — BRAIN NATRIURETIC PEPTIDE: B Natriuretic Peptide: 17.3 pg/mL (ref 0.0–100.0)

## 2020-02-06 LAB — TROPONIN I (HIGH SENSITIVITY): Troponin I (High Sensitivity): 3 ng/L (ref <18)

## 2020-02-06 NOTE — ED Triage Notes (Signed)
Pt via pov from home with sharp pain "in the middle of my heart". Pt states he has hx of heart failure and has had this before but that it usually resolves quickly. This time it has been constant, and he is concerned and anxious about it. Denies n/v/diaphoresis. Pt alert & oriented, nad noted.

## 2020-02-06 NOTE — ED Notes (Signed)
Pt having to leave before second troponin collected. Pt in NAD and ambulatory to lobby at time of disposition. Pt encouraged to follow up with cardiologist. Pt voices no other concerns at this time.

## 2020-02-06 NOTE — ED Provider Notes (Signed)
°  ER Provider Note       Time seen: 10:07 AM    I have reviewed the vital signs and the nursing notes.  HISTORY   Chief Complaint Chest Pain   HPI Allen Bass is a 44 y.o. male with a history of CHF, polysubstance abuse who presents today for sharp pain in the middle of his "heart".  Patient states he has a history of heart failure and has had this before but usually resolves quickly.  Pain is constant, he is concerned and anxious about it.  Discomfort is 4 out of 10.  Past Medical History:  Diagnosis Date   CHF (congestive heart failure) (HCC)    Polysubstance abuse (HCC)    a. ongoing cocaine, heroin, marijuana, and tobacco abuse    Past Surgical History:  Procedure Laterality Date   HERNIA REPAIR      Allergies Patient has no known allergies.  Review of Systems Constitutional: Negative for fever. Cardiovascular: Positive for chest pain Respiratory: Negative for shortness of breath. Gastrointestinal: Negative for abdominal pain, vomiting and diarrhea. Musculoskeletal: Negative for back pain. Skin: Negative for rash. Neurological: Negative for headaches, focal weakness or numbness.  All systems negative/normal/unremarkable except as stated in the HPI  ____________________________________________   PHYSICAL EXAM:  VITAL SIGNS: Vitals:   02/06/20 0826  BP: 126/81  Pulse: 77  Resp: 20  Temp: (!) 97.5 F (36.4 C)  SpO2: 99%    Constitutional: Alert and oriented. Well appearing and in no distress. Eyes: Conjunctivae are normal. Normal extraocular movements. ENT      Head: Normocephalic and atraumatic.      Nose: No congestion/rhinnorhea.      Mouth/Throat: Mucous membranes are moist.      Neck: No stridor. Cardiovascular: Normal rate, regular rhythm. No murmurs, rubs, or gallops. Respiratory: Normal respiratory effort without tachypnea nor retractions. Breath sounds are clear and equal bilaterally. No  wheezes/rales/rhonchi. Gastrointestinal: Soft and nontender. Normal bowel sounds Musculoskeletal: Nontender with normal range of motion in extremities. No lower extremity tenderness nor edema. Neurologic:  Normal speech and language. No gross focal neurologic deficits are appreciated.  Skin:  Skin is warm, dry and intact. No rash noted. Psychiatric: Speech and behavior are normal.  ____________________________________________  EKG: Interpreted by me.  Sinus rhythm with rate of 67 bpm, borderline prolonged PR interval, low voltage, normal QT  ____________________________________________   LABS (pertinent positives/negatives)  Labs Reviewed  BASIC METABOLIC PANEL - Abnormal; Notable for the following components:      Result Value   Glucose, Bld 102 (*)    All other components within normal limits  CBC  BRAIN NATRIURETIC PEPTIDE  TROPONIN I (HIGH SENSITIVITY)  TROPONIN I (HIGH SENSITIVITY)    RADIOLOGY  Chest x-ray does not reveal any acute process  DIFFERENTIAL DIAGNOSIS  Musculoskeletal pain, GERD, anxiety, substance abuse, MI, PE  ASSESSMENT AND PLAN  Chest pain   Plan: The patient had presented for nonspecific chest pain. Patient's labs were unremarkable, he was unwilling to stay for repeat troponin.  He is cleared for outpatient follow-up.   Daryel November MD    Note: This note was generated in part or whole with voice recognition software. Voice recognition is usually quite accurate but there are transcription errors that can and very often do occur. I apologize for any typographical errors that were not detected and corrected.     Emily Filbert, MD 02/06/20 1119
